# Patient Record
Sex: Male | Born: 1940 | Race: White | Hispanic: No | Smoking: Current every day smoker
Health system: Southern US, Community
[De-identification: ages and names within clinical notes are randomized; demographics above are authoritative.]

## PROBLEM LIST (undated history)

## (undated) DIAGNOSIS — R413 Other amnesia: Secondary | ICD-10-CM

## (undated) DIAGNOSIS — G4719 Other hypersomnia: Secondary | ICD-10-CM

## (undated) DIAGNOSIS — I739 Peripheral vascular disease, unspecified: Secondary | ICD-10-CM

## (undated) DIAGNOSIS — M25551 Pain in right hip: Secondary | ICD-10-CM

## (undated) DIAGNOSIS — G20 Parkinson's disease: Secondary | ICD-10-CM

## (undated) DIAGNOSIS — F17200 Nicotine dependence, unspecified, uncomplicated: Secondary | ICD-10-CM

## (undated) DIAGNOSIS — G252 Other specified forms of tremor: Secondary | ICD-10-CM

## (undated) DIAGNOSIS — F418 Other specified anxiety disorders: Secondary | ICD-10-CM

## (undated) DIAGNOSIS — J449 Chronic obstructive pulmonary disease, unspecified: Secondary | ICD-10-CM

## (undated) DIAGNOSIS — E7800 Pure hypercholesterolemia, unspecified: Secondary | ICD-10-CM

## (undated) HISTORY — DX: Nicotine dependence, unspecified, uncomplicated: F17.200

## (undated) HISTORY — DX: Pain in right hip: M25.551

## (undated) HISTORY — DX: Chronic obstructive pulmonary disease, unspecified: J44.9

## (undated) HISTORY — DX: Other specified anxiety disorders: F41.8

## (undated) HISTORY — DX: Other amnesia: R41.3

## (undated) HISTORY — DX: Parkinson's disease: G20

## (undated) HISTORY — DX: Pure hypercholesterolemia, unspecified: E78.00

## (undated) HISTORY — DX: Peripheral vascular disease, unspecified: I73.9

## (undated) HISTORY — DX: Other specified forms of tremor: G25.2

## (undated) HISTORY — DX: Other hypersomnia: G47.19

---

## 2015-11-07 ENCOUNTER — Encounter: Payer: Self-pay | Admitting: Neurology

## 2015-11-14 ENCOUNTER — Encounter: Payer: Self-pay | Admitting: Neurology

## 2017-08-26 ENCOUNTER — Ambulatory Visit: Payer: Medicare Other | Admitting: Neurology

## 2017-09-30 ENCOUNTER — Encounter: Payer: Self-pay | Admitting: Neurology

## 2017-09-30 ENCOUNTER — Telehealth: Payer: Self-pay

## 2017-09-30 ENCOUNTER — Ambulatory Visit (INDEPENDENT_AMBULATORY_CARE_PROVIDER_SITE_OTHER): Payer: Medicare Other | Admitting: Neurology

## 2017-09-30 VITALS — BP 161/79 | HR 57 | Ht 76.0 in | Wt 198.0 lb

## 2017-09-30 DIAGNOSIS — G20 Parkinson's disease: Secondary | ICD-10-CM

## 2017-09-30 NOTE — Patient Instructions (Signed)
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 I would like to review records from [place], [place] and your sister will call with the name of the doctor or the office and we will try to request previous records.   Please be cautious with changing positions as you balance is impaired. Please try to stand up slowly and get her bearings first. I would like for you to increase your water intake. Please stop smoking.   We may consider Parkinson's medication soon as well as medication for memory loss in the near future. Again, I would like to see if you have tried medications for Parkinson's disease before and/or for memory loss.  Try to stay active physically and mentally. Engage in social activities in your community and with your family and try to keep up with current events by reading the newspaper or watching the news.   As far as diagnostic testing, I may order a brain scan, called MRI in the near future, if you have not had one before in [place].  I would like to see you back in 4 months, sooner if we need to.  Our phone number is [phone]. We also have an after hours call service for urgent matters and there is a physician on-call for urgent questions, that cannot wait till the next work day. For any emergencies you know to call 911 or go to the nearest emergency room.

## 2017-09-30 NOTE — Telephone Encounter (Signed)
Pt reports that he was diagnosed with Parkinson's disease at a medical center in [place], [place] but is unsure of the name. He will call us back with the name of the center. Pt's MR release form has been filled out and signed, except for the name of the center.

## 2017-09-30 NOTE — Progress Notes (Signed)
Subjective:    Patient ID: Chase Logan is a 77 y.o. male.  HPI     Chase Foley, MD, PhD [facility] [address], [address] [address] [place], [place] [address]  Dear Dr. Petra Kuba,  I saw your patient, Chase Logan, upon your kind request in my neurologic clinic today for initial consultation of his tremors, concern for parkinsonism. The patient is accompanied by his younger sister today. As you know, Chase Logan is a 77 year old right-handed gentleman with an underlying medical history of smoking, COPD, anxiety, peripheral vascular disease, hyperlipidemia, and right hip pain, who reports a prior diagnosis of Parkinson's disease. He is not sure if he tried medication for this. He also has memory loss per sister's report. Neither one is sure whether he tried medication for memory loss before. He moved from [place], [place]. Prior neurology records are not available today for my review. He is widowed and has one daughter who lives in [place]. He has 2 grandchildren and 2 great-grandchildren. He is one of 14 kids. Most of his siblings have passed away. His sister reports that there is a family history of tremors in their father and one sister has tremors but no actual Parkinson's disease is reported. I reviewed your office note from 06/18/2017, which you kindly included. He is not sure if he had brain scans before. He is not able to recognize any Parkinson's medications by name. He used to live with a roommate in [place], [place]. He now lives with his sister. He smokes and has no intention to quit smoking. He has been smoking for 70 years. He does not utilize any alcohol but likes to drink caffeine. He reports no recent fall. His sister has noted short-term memory issues and balance issues. She believes that the tremor is worse on his left. His sister also reports that he was tested for his hearing and required hearing aids but he refused them.  His Past Medical History Is  Significant For: Past Medical History:  Diagnosis Date  . COPD (chronic obstructive pulmonary disease) (HCC)   . Nicotine dependence   . Other amnesia   . Other hypersomnia   . Other specified anxiety disorders   . Other specified forms of tremor   . Pain in right hip   . Parkinson's disease (HCC)   . Peripheral vascular disease (HCC)   . Pure hypercholesterolemia    His Past Surgical History Is Significant For:  His Family History Is Significant For: Family History  Problem Relation Age of Onset  . Stroke Mother   . Hypertension Mother   . Hypertension Father   . Lung cancer Father     His Social History Is Significant For: Social History   Socioeconomic History  . Marital status: Widowed    Spouse name: Not on file  . Number of children: Not on file  . Years of education: Not on file  . Highest education level: Not on file  Occupational History  . Not on file  Social Needs  . Financial resource strain: Not on file  . Food insecurity:    Worry: Not on file    Inability: Not on file  . Transportation needs:    Medical: Not on file    Non-medical: Not on file  Tobacco Use  . Smoking status: Current Every Day Smoker    Packs/day: 1.00  . Smokeless tobacco: Never Used  Substance and Sexual Activity  . Alcohol use: Not Currently  . Drug use:  Never  . Sexual activity: Not on file  Lifestyle  . Physical activity:    Days per week: Not on file    Minutes per session: Not on file  . Stress: Not on file  Relationships  . Social connections:    Talks on phone: Not on file    Gets together: Not on file    Attends religious service: Not on file    Active member of club or organization: Not on file    Attends meetings of clubs or organizations: Not on file    Relationship status: Not on file  Other Topics Concern  . Not on file  Social History Narrative  . Not on file    His Allergies Are:  No Known Allergies:   His Current Medications Are:  Outpatient  Encounter Medications as of 09/30/2017  Medication Sig  . ALPRAZolam (XANAX) 0.25 MG tablet Take 0.25 mg by mouth every 6 (six) hours as needed.   Marland Kitchen aspirin EC 81 MG tablet Take 81 mg by mouth daily.  Marland Kitchen atorvastatin (LIPITOR) 20 MG tablet Take 20 mg by mouth daily.  . budesonide-formoterol (SYMBICORT) 160-4.5 MCG/ACT inhaler Inhale 2 puffs into the lungs 2 (two) times daily.  . [DISCONTINUED] albuterol (PROVENTIL HFA;VENTOLIN HFA) 108 (90 Base) MCG/ACT inhaler Inhale 2 puffs into the lungs every 6 (six) hours as needed for wheezing or shortness of breath.   No facility-administered encounter medications on file as of 09/30/2017.   : Review of Systems:  Out of a complete 14 point review of systems, all are reviewed and negative with the exception of these symptoms as listed below:  Review of Systems  Neurological:       Pt presents today to discuss his PD. Pt reports that he was diagnosed with PD while living in [place] and needs neurological care in [place]. Pt is right handed.     Objective:  Neurological Exam  Physical Exam Physical Examination:   Vitals:   09/30/17 1440  BP: (!) 161/79  Pulse: (!) 57   General Examination: The patient is a very pleasant 77 y.o. male in no acute distress. He appears mildly deconditioned.   HEENT: Normocephalic, atraumatic, pupils are equal, round and reactive to light and accommodation. Extraocular tracking is impaired, he has right eye exotropia and lazy eye, focuses primarily with the left eye. Speech is not hypophonic, no significant dysarthria. Airway examination reveals moderate mouth dryness, intermittent lower lip tremor, moderate nuchal rigidity. Face is symmetric with mild facial masking noted. Tongue protrudes centrally and palate elevates symmetrically. Full dentures.   Chest: Clear to auscultation without wheezing, rhonchi or crackles noted.  Heart: S1+S2+0, regular and normal without murmurs, rubs or gallops noted.   Abdomen: Soft,  non-tender and non-distended with normal bowel sounds appreciated on auscultation.  Extremities: There is no pitting edema in the distal lower extremities bilaterally.   Skin: Warm and quite dry and flaky.   Musculoskeletal: exam reveals no obvious joint deformities, tenderness or joint swelling or erythema.   Neurologically:  Mental status: The patient is awake, alert and oriented in all 4 spheres. His immediate and remote memory, attention, language skills and fund of knowledge are appropriate. There is no evidence of aphasia, agnosia, apraxia or anomia. Speech is clear with normal prosody and enunciation. Thought process is linear. Mood is normal and affect is normal.  Cranial nerves II - XII are as described above under HEENT exam.  Motor exam: Thin bulk, mildly increased tone is noted on  the left. He has a mild to moderate resting tremor in the left upper extremity, a mild and intermittent resting tremor in the right upper extremity. Fine motor skills with finger taps and rapid alternating patting is mildly impaired on the left, slightly better on the right, foot taps and foot agility are mild to moderately impaired on the left and better on the right.   On 09/30/2017: Archimedes spiral drawing he has coarse difficulty with both hands. Handwriting is fairly large, tremulous, still legible, not obviously micrographic. Romberg is not tested for safety reasons. Reflexes are 1+ throughout. Fine motor skills and coordination: intact with normal finger taps, normal hand movements, normal rapid alternating patting, normal foot taps and normal foot agility.  Cerebellar testing: No dysmetria or intention tremor.  Sensory exam: intact to light touch.  Gait, station and balance: He stands up with mild difficulty and has a tendency to veer backwards. He did not bring a cane or walker. He walks slightly too fast, has mild decrease in arm swing, has a tendency to be unstable when turning but he has a tendency  to turn too fast as well. Tandem walk is not tested for safety reasons.   Assessment and Plan:    In summary, Chase Logan is a very pleasant 77 y.o.-year old male with an underlying medical history of smoking, COPD, anxiety, peripheral vascular disease, hyperlipidemia, and right hip pain, who presents for neurologic evaluation of his tremors and prior diagnosis of Parkinson's disease. 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 His history is not very elaborate. Unfortunately, prior neurology records are not available for my review today and we will request records once we have the name of the doctor or the clinic he was used to go to in [place], [place]. His sister will look at home for any prior medical records or name for the doctor. He is not on any medication for Parkinson's disease or tremor at this time. I would like to hold off until I can review records as to what he has tried in the past. 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 His smoking is also a vascular risk factor unfortunately. He is not motivated to quit smoking at this point. They report a family history of tremors. He may have had a brain scan in the past but it is not clear, again, I will await records to review. I may ask him to start Sinemet and low dose with gradual titration. He indicates that if he does not see any improvement he will not take the medication. He is encouraged to wait things out for now. He is advised to pursue a healthy lifestyle with increase in exercise level and increase in water intake. He does admit that he does not drink any water but 1 or 2 cups per day. I may order a brain MRI he has not had one in the recent past. I would like to see him back in about 3-4 months, sooner if needed. We will call him after I have received records from [place]. I answered all her  questions today and the patient and his sister were in agreement.   Thank you very much for allowing me to participate in the care of this nice patient. If I can be of any further assistance to you please do not hesitate to call me at [phone].  Sincerely,   Chase FoleySaima Puja Caffey, MD, PhD

## 2017-10-07 NOTE — Telephone Encounter (Signed)
[facility] records, imaging reports hospital summary. [phone]

## 2017-10-07 NOTE — Telephone Encounter (Signed)
Patient's sister Chase Logan (on [place]) calling to give names of medical centers patient was seen at in [place]. He was seen at [facility] in [place] and [facility] in [place].  The medication these offices prescribed were Baclofen 10mg  tid, Lisinopril 10mg  once a day and Meloxicam 7.5mg . The patient is not taking these medications.

## 2018-02-03 ENCOUNTER — Ambulatory Visit: Payer: Medicare Other | Admitting: Neurology

## 2018-02-09 ENCOUNTER — Other Ambulatory Visit (HOSPITAL_COMMUNITY): Payer: Self-pay | Admitting: Pulmonary Disease

## 2018-02-09 ENCOUNTER — Ambulatory Visit (HOSPITAL_COMMUNITY)
Admission: RE | Admit: 2018-02-09 | Discharge: 2018-02-09 | Disposition: A | Discharge status: Home / Self Care | Payer: Medicare Other | Source: Ambulatory Visit | Attending: Pulmonary Disease | Admitting: Pulmonary Disease

## 2018-02-09 DIAGNOSIS — W19XXXA Unspecified fall, initial encounter: Secondary | ICD-10-CM

## 2018-02-09 DIAGNOSIS — M25551 Pain in right hip: Secondary | ICD-10-CM | POA: Insufficient documentation

## 2018-02-09 DIAGNOSIS — M47816 Spondylosis without myelopathy or radiculopathy, lumbar region: Secondary | ICD-10-CM | POA: Diagnosis not present

## 2018-03-30 ENCOUNTER — Telehealth: Payer: Self-pay | Admitting: Neurology

## 2018-03-30 ENCOUNTER — Encounter: Payer: Self-pay | Admitting: Neurology

## 2018-03-30 ENCOUNTER — Ambulatory Visit (INDEPENDENT_AMBULATORY_CARE_PROVIDER_SITE_OTHER): Payer: Medicare Other | Admitting: Neurology

## 2018-03-30 VITALS — BP 149/79 | HR 63 | Ht 74.0 in | Wt 193.0 lb

## 2018-03-30 DIAGNOSIS — F17200 Nicotine dependence, unspecified, uncomplicated: Secondary | ICD-10-CM | POA: Diagnosis not present

## 2018-03-30 DIAGNOSIS — R413 Other amnesia: Secondary | ICD-10-CM | POA: Diagnosis not present

## 2018-03-30 DIAGNOSIS — G20 Parkinson's disease: Secondary | ICD-10-CM

## 2018-03-30 MED ORDER — CARBIDOPA-LEVODOPA 25-100 MG PO TABS: ORAL_TABLET | ORAL | 5 refills | Status: DC

## 2018-03-30 NOTE — Patient Instructions (Addendum)
We will start you on Sinemet (generic name: carbidopa-levodopa) 25/100 mg: Take half a pill twice daily (8 AM and noon) for one week, then half a pill 3 times a day (8 AM, noon, and 4 PM) for one week, then one pill 3 times a day thereafter. Please try to take the medication away from you mealtimes, that is, ideally either one hour before or 2 hours after your meal to ensure optimal absorption. The medication can interfere with the protein content of your meal and trying to the protein in your food and therefore not get fully absorbed.   Common side effects reported are: Nausea, vomiting, sedation, confusion, lightheadedness. Rare side effects include hallucinations, severe nausea or vomiting, diarrhea and significant drop in blood pressure especially when going from lying to standing or from sitting to standing.   Please stop smoking. Please increase your water intake to about 6 cups per day.   We will do a brain scan, called MRI and call you with the test results. We will have to schedule you for this on a separate date. This test requires authorization from your insurance, and we will take care of the insurance process.

## 2018-03-30 NOTE — Progress Notes (Signed)
Subjective:    Patient ID: Chase Logan is a 78 y.o. male.  HPI     Interim history:   Chase Logan is a 78 year old right-handed gentleman with an underlying medical history of smoking, COPD, anxiety, peripheral vascular disease, hyperlipidemia, and right hip pain, who presents for follow-up consultation of his parkinsonism. The patient is accompanied by his sister again today. I first met him on 09/30/2017 at the request of his primary care physician, at which time the patient reported a prior diagnosis of Parkinson's disease. He was not on any medication for this. He had moved from [place], [place]. I suggested her review records in the interim from his prior neurologist. He was advised to quit smoking. His examination showed signs of left-sided predominant parkinsonism. I was not able to review any neurology records. I received some emergency room records from [place].  Today, 03/30/2018: He reports a new symptoms but his sister is concerned about his memory loss. He is more and more forgetful. He still smokes, he is not willing to quit smoking. He does not always hydrate well with water although he does not mind drinking water. He averages about 2-3 cups of water per day and drinks some milk and some coffee, 1 soda per day on average. No recent falls.  The patient's allergies, current medications, family history, past medical history, past social history, past surgical history and problem list were reviewed and updated as appropriate.   Previously:  09/30/2017: (He) reports a prior diagnosis of Parkinson's disease. He is not sure if he tried medication for this. He also has memory loss per sister's report. Neither one is sure whether he tried medication for memory loss before. He moved from [place], [place]. Prior neurology records are not available today for my review. He is widowed and has one daughter who lives in [place]. He has 2 grandchildren and 2 great-grandchildren. He is one of 14 kids.  Most of his siblings have passed away. His sister reports that there is a family history of tremors in their father and one sister has tremors but no actual Parkinson's disease is reported. I reviewed your office note from 06/18/2017, which you kindly included. He is not sure if he had brain scans before. He is not able to recognize any Parkinson's medications by name. He used to live with a roommate in [place], [place]. He now lives with his sister. He smokes and has no intention to quit smoking. He has been smoking for 70 years. He does not utilize any alcohol but likes to drink caffeine. He reports no recent fall. His sister has noted short-term memory issues and balance issues. She believes that the tremor is worse on his left. His sister also reports that he was tested for his hearing and required hearing aids but he refused them.  His Past Medical History Is Significant For: Past Medical History:  Diagnosis Date  . COPD (chronic obstructive pulmonary disease) ([place])   . Nicotine dependence   . Other amnesia   . Other hypersomnia   . Other specified anxiety disorders   . Other specified forms of tremor   . Pain in right hip   . Parkinson's disease ([place])   . Peripheral vascular disease ([place])   . Pure hypercholesterolemia    His Past Surgical History Is Significant For:  His Family History Is Significant For: Family History  Problem Relation Age of Onset  . Stroke Mother   . Hypertension Mother   . Hypertension Father   .  Lung cancer Father     His Social History Is Significant For: Social History   Socioeconomic History  . Marital status: Widowed    Spouse name: Not on file  . Number of children: Not on file  . Years of education: Not on file  . Highest education level: Not on file  Occupational History  . Not on file  Social Needs  . Financial resource strain: Not on file  . Food insecurity:    Worry: Not on file    Inability: Not on file  . Transportation needs:     Medical: Not on file    Non-medical: Not on file  Tobacco Use  . Smoking status: Current Every Day Smoker    Packs/day: 1.00  . Smokeless tobacco: Never Used  Substance and Sexual Activity  . Alcohol use: Not Currently  . Drug use: Never  . Sexual activity: Not on file  Lifestyle  . Physical activity:    Days per week: Not on file    Minutes per session: Not on file  . Stress: Not on file  Relationships  . Social connections:    Talks on phone: Not on file    Gets together: Not on file    Attends religious service: Not on file    Active member of club or organization: Not on file    Attends meetings of clubs or organizations: Not on file    Relationship status: Not on file  Other Topics Concern  . Not on file  Social History Narrative  . Not on file    His Allergies Are:  No Known Allergies:   His Current Medications Are:  Outpatient Encounter Medications as of 03/30/2018  Medication Sig  . ALPRAZolam (XANAX) 0.25 MG tablet Take 0.25 mg by mouth every 6 (six) hours as needed.   Marland Kitchen aspirin EC 81 MG tablet Take 81 mg by mouth daily.  Marland Kitchen atorvastatin (LIPITOR) 20 MG tablet Take 20 mg by mouth daily.  . budesonide-formoterol (SYMBICORT) 160-4.5 MCG/ACT inhaler Inhale 2 puffs into the lungs 2 (two) times daily.   No facility-administered encounter medications on file as of 03/30/2018.   :  Review of Systems:  Out of a complete 14 point review of systems, all are reviewed and negative with the exception of these symptoms as listed below: Review of Systems  Neurological:       Pt presents today to discuss his memory and PD. Pt's sister reports that pt has become increasingly forgetful. Pt's sister reports that pt often looks like he will lose his balance when he stands.    Objective:  Neurological Exam  Physical Exam Physical Examination:   Vitals:   03/30/18 1042  BP: (!) 149/79  Pulse: 63    General Examination: The patient is a very pleasant 78 y.o. male in no  acute distress. He appears Mildly deconditioned, adequately groomed.   HEENT: Normocephalic, atraumatic, pupils are equal, round and reactive to light and accommodation. Extraocular tracking is impaired, he has right eye exotropia and lazy eye, focuses primarily with the left eye. Speech is not hypophonic, no significant dysarthria. Airway examination reveals moderate mouth dryness, intermittent lower lip tremor, moderate nuchal rigidity. Face is symmetric with mild facial masking noted. Tongue protrudes centrally and palate elevates symmetrically. Full dentures. No change in exam.   Chest: Clear to auscultation without wheezing, rhonchi or crackles noted.  Heart: S1+S2+0, regular and normal without murmurs, rubs or gallops noted.   Abdomen: Soft, non-tender and non-distended  with normal bowel sounds appreciated on auscultation.  Extremities: There is no pitting edema in the distal lower extremities bilaterally.   Skin: Warm and quite dry and flaky, chronic appearing discoloration.   Musculoskeletal: exam reveals no obvious joint deformities, tenderness or joint swelling or erythema.   Neurologically:  Mental status: The patient is awake, alert and oriented in all 4 spheres. His immediate and remote memory, attention, language skills and fund of knowledge are fairly appropriate. There is no evidence of aphasia, agnosia, apraxia or anomia. Speech is clear with normal prosody and enunciation. Thought process is linear. Mood is normal and affect is blunted.   Cranial nerves II - XII are as described above under HEENT exam.  Motor exam: Thin bulk, mildly increased tone is noted on the left. He has a mild to moderate resting tremor in the left upper extremity, a mild and intermittent resting tremor in the right upper extremity. Fine motor skills with finger taps and rapid alternating patting is mildly impaired on the left, slightly better on the right, foot taps and foot agility are mild to  moderately impaired on the left and better on the right, stable.   (On 09/30/2017: Archimedes spiral drawing he has coarse difficulty with both hands. Handwriting is fairly large, tremulous, still legible, not obviously micrographic.)   On 03/30/2018: MMSE: 25/30, CDT: 1/4, AFT: 8/min.   Romberg is not tested for safety reasons. Reflexes are 1+ throughout.  Cerebellar testing: No dysmetria or intention tremor.  Sensory exam: intact to light touch.  Gait, station and balance: He stands up with mild difficulty and has a tendency to veer backwards. He did not bring a cane or walker. He walks slightly too fast, has mild decrease in arm swing, has a tendency to be unstable when turning but he has a tendency to turn too fast as well. Tandem walk is not tested for safety reasons.   Assessment and Plan:    In summary, Chase Logan is a very pleasant 78 year old male with an underlying medical history of smoking, COPD, anxiety, peripheral vascular disease, hyperlipidemia, and right hip pain, who presents for neurologic evaluation of his tremors and prior diagnosis of Parkinson's disease. 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 His history is not very elaborate. Unfortunately, prior neurology records were not available or nonexistent. We will proceed with a brain MRI without contrast and a trial of Sinemet low-dose. He has some associated memory loss but also has significant vascular risk factors including a very long-standing history of smoking, he is not willing to quit smoking and is again urged to work on smoking cessation. He is advised to stay better hydrated with water. We talked about Sinemet, its expectations, side effects and scheduling as well as the titration. He was given written instructions as well as a new prescription. I suggested a 3 month recheck at which time we may consider a memory  medication as well. We will keep them posted as to his MRI results in the interim. I answered all their questions today and the patient and his sister were in agreement. I spent 25 minutes in total face-to-face time with the patient, more than 50% of which was spent in counseling and coordination of care, reviewing test results, reviewing medication and discussing or reviewing the diagnosis of PD its prognosis and treatment options. Pertinent laboratory and imaging test results that were available during this visit with the patient were reviewed  by me and considered in my medical decision making (see chart for details).

## 2018-03-30 NOTE — Telephone Encounter (Signed)
[employer] Medicare/medicaid order sent to [facility] for pt to be aware. I gave him [facility] phone number of [phone] and to give them a call if he has not heard in the next 2-3 business days.

## 2018-04-20 ENCOUNTER — Ambulatory Visit
Admission: RE | Admit: 2018-04-20 | Discharge: 2018-04-20 | Disposition: A | Discharge status: Home / Self Care | Payer: Medicare Other | Source: Ambulatory Visit | Attending: Neurology | Admitting: Neurology

## 2018-04-20 DIAGNOSIS — R413 Other amnesia: Secondary | ICD-10-CM

## 2018-04-20 DIAGNOSIS — F17200 Nicotine dependence, unspecified, uncomplicated: Secondary | ICD-10-CM

## 2018-04-20 DIAGNOSIS — G20 Parkinson's disease: Secondary | ICD-10-CM

## 2018-04-21 NOTE — Progress Notes (Signed)
Please call patient regarding the recent brain MRI: The brain scan showed moderate volume loss, which we call atrophy. There were changes in the deeper structures of the brain, which we call white matter changes or microvascular changes. These were reported as mild in His case. These are tiny white spots, that occur with time and are seen in a variety of conditions, including with normal aging, chronic hypertension, chronic headaches, especially migraine HAs, chronic diabetes, chronic hyperlipidemia. These are not strokes and no mass or lesion were seen which is reassuring. Again, there were no acute findings, such as a stroke, or mass or blood products. No further action is required on this test at this time, other than re-enforcing the importance of good blood pressure control, good cholesterol control, good blood sugar control, and weight management.  He can FU as scheduled in April 2020.  [name], MD, PhD

## 2018-04-22 ENCOUNTER — Telehealth: Payer: Self-pay

## 2018-04-22 NOTE — Telephone Encounter (Signed)
I called pt to discuss his MRI results. No answer, left a message asking him to call me back. 

## 2018-04-22 NOTE — Telephone Encounter (Signed)
Pt sister has returned call to SYSCON Kristen, she is asking for a call back

## 2018-04-22 NOTE — Telephone Encounter (Signed)
-----   Message from [name], MD sent at 04/21/2018  5:25 PM EST ----- Please call patient regarding the recent brain MRI: The brain scan showed moderate volume loss, which we call atrophy. There were changes in the deeper structures of the brain, which we call white matter changes or microvascular changes. These were reported as mild in His case. These are tiny white spots, that occur with time and are seen in a variety of conditions, including with normal aging, chronic hypertension, chronic headaches, especially migraine HAs, chronic diabetes, chronic hyperlipidemia. These are not strokes and no mass or lesion were seen which is reassuring. Again, there were no acute findings, such as a stroke, or mass or blood products. No further action is required on this test at this time, other than re-enforcing the importance of good blood pressure control, good cholesterol control, good blood sugar control, and weight management.  He can FU as scheduled in April 2020.  [name], MD, PhD

## 2018-04-22 NOTE — Telephone Encounter (Signed)
Pt's sister, Angelique Blonder, per [facility], returned my call. I advised her of pt's MRI results. I reminded pt's sister of pt's appt in April. Pt's sister verbalized understanding of results and recommendations and had no questions at this time.

## 2018-05-06 ENCOUNTER — Telehealth: Payer: Self-pay | Admitting: Neurology

## 2018-05-06 NOTE — Telephone Encounter (Signed)
I have no other suggestions for now. Let's see how he does on the smaller dose of his Sinemet. Also, he has a prescription for Xanax or alprazolam listed, I'm not sure how much he is taking of this, this can make him sleepy as well. If he is confused, he may need to get checked out for an underlying acute illness such as urinary tract infection or electrolyte disturbance and may have to be seen by his primary care physician for this and get labs and urine checked, if not already done on Tuesday.

## 2018-05-06 NOTE — Telephone Encounter (Signed)
Pt's sister Denise/DPR said Dr Gilpatrick/PCP cut carbidopa-levodopa (SINEMET IR) 25-100 MG tablet (on 05/04/18)to 1/2 pill 2 x day due to side effects. The patient is sleeping a lot, not eating (he has lost 14 pounds), and is very confused, more forgetful than before starting the medication. Please call to advise

## 2018-05-06 NOTE — Telephone Encounter (Signed)
I called [place], per DPR and discussed Dr. Teofilo Pod recommendations for the pt. Pt's sister does not think that his symptoms are being caused by the xanax. She will follow up with pt's PCP regarding labs and a urine test and to discuss his weight loss and the work up for an acute illness. Pt's sister verbalized understanding of the recommendations.

## 2018-05-06 NOTE — Telephone Encounter (Signed)
I called pt's sister, Angelique Blonder per [place]. She reports the sinemet has made the pt sleepy and confused. Pt saw his PCP Dr. Petra Kuba on Tuesday and he recommended that pt scale back to 1/2 tablet BID. They started this yesterday and pt's sister feels that it is too soon to see if the reduction is helping pt's symptoms. Pt's sister is wondering if there is anything else that Dr. Frances Furbish recommends.

## 2018-05-24 ENCOUNTER — Encounter: Payer: Self-pay | Admitting: Neurology

## 2018-06-24 NOTE — Progress Notes (Addendum)
PATIENT: Chase Logan DOB: 1940/04/24  REASON FOR VISIT: follow up HISTORY FROM: patient  Virtual Visit via Telephone Note  I connected with Chase Logan on 06/29/18 at 10:30 AM EDT by telephone and verified that I am speaking with the correct person using two identifiers.   I discussed the limitations, risks, security and privacy concerns of performing an evaluation and management service by telephone and the availability of in person appointments. I also discussed with the patient that there may be a patient responsible charge related to this service. The patient expressed understanding and agreed to proceed.   History of Present Illness:  06/29/18 Chase Logan is a 78 y.o. male for follow up of Parkinsonism.  Telephone visit completed with both [place] Logan and his Sister [place] Logan, with patient's permission.  Ch[place] reports that [place] did not tolerate Sinemet at all.  When taking 3 tablets/day she reports that his memory was significantly worse, he was much more sleepy, and seemed more confused than normal.  She does not feel that it helped with his tremor at all.  She continues to be very concerned about his memory.  She states that although this is better since decreasing Sinemet to one half of a tablet daily, he continues to struggle with short-term memory loss.  She states that about 3 months ago he had driven 93 miles out of the way as he could not remember how to return home.  She has recently taking his car keys and he is no longer driving.  She states that he is very repetitive in his speech.  He is able to perform activities of daily living but she shows some concern as yesterday he left the stove on after cooking his meal.  She is concerned about his safety.  She reports that he is unstable with his gait.  She denies any falls.  She has recently moved to [place] to help care for [place].  There are no other family members in the area.  She does not have a good support system to  help care for [facility].  She lives in [place] mobile [place] and has reached out to her neighbors for assistance from time to time.  She continues to work part-time.  She reports that they have an appointment in 2 weeks with his primary care provider to discuss options for in-home care or assisted living facilities.   History (copied from Dr Chase Logan note on 03/30/2018)  Chase Logan is a 78 year old right-handed gentleman with an underlying medical history of smoking, COPD, anxiety, peripheral vascular disease, hyperlipidemia, and right hip pain, who presents for follow-up consultation of his parkinsonism. The patient is accompanied by his sister again today. I first met him on 09/30/2017 at the request of his primary care physician, at which time the patient reported a prior diagnosis of Parkinsons disease. He was not on any medication for this. He had moved from [place], [place]. I suggested her review records in the interim from his prior neurologist. He was advised to quit smoking. His examination showed signs of left-sided predominant parkinsonism. I was not able to review any neurology records. I received some emergency room records from [place].  Today, 03/30/2018: He reports a new symptoms but his sister is concerned about his memory loss. He is more and more forgetful. He still smokes, he is not willing to quit smoking. He does not always hydrate well with water although he does not mind drinking water. He averages about 2-3  cups of water per day and drinks some milk and some coffee, 1 soda per day on average. No recent falls.  The patient's allergies, current medications, family history, past medical history, past social history, past surgical history and problem list were reviewed and updated as appropriate.   Previously:  09/30/2017: (He) reports a prior diagnosis of Parkinsons disease. He is not sure if he tried medication for this. He also has memory loss per sisters report. Neither one is sure  whether he tried medication for memory loss before. He moved from [place], [place].Prior neurology records are not available today for my review.He is widowed and has one daughter who lives in [place]. He has 2 grandchildren and 2 great-grandchildren. He is one of 14 kids. Most of his siblings have passed away. His sister reports that there is a family history of tremors in their father and one sister has tremors but no actual Parkinsons disease is reported. I reviewed your office note from 06/18/2017, which you kindly included. He is not sure if he had brain scans before. He is not able to recognize any Parkinsons medications by name. He used to live with a roommate in [place], [place]. He now lives with his sister. He smokes and has no intention to quit smoking. He has been smoking for 70 years. He does not utilize any alcohol but likes to drink caffeine. He reports no recent fall. His sister has noted short-term memory issues and balance issues. She believes that the tremor is worse on his left.His sister also reports that he was tested for his hearing and required hearing aids but he refused them.   Observations/Objective:  Generalized: in no acute distress  Mentation: Alert, NOT oriented to time or history taking. He is oriented to place. Follows all commands speech and language fluent.  Patient is very repetitive in his speech. Blind [place] performed via telephone conference.  Patient score is 12/22   Assessment and Plan:  78 y.o. year old male  has a past medical history of COPD (chronic obstructive pulmonary disease) ([place]), Nicotine dependence, Other amnesia, Other hypersomnia, Other specified anxiety disorders, Other specified forms of tremor, Pain in right hip, Parkinson's disease ([place]), Peripheral vascular disease ([place]), and Pure hypercholesterolemia. here with    ICD-10-CM   1. Memory loss R41.3 donepezil (ARICEPT) 5 MG tablet  2. Gait instability R26.81 Ambulatory referral to Physical  Therapy  3. Parkinsonism, unspecified Parkinsonism type ([place]) G20    Chase Logan is adamant that Zaim does not respond well to Sinemet and she does not want him taking this medication at this time.  I have advised that she stop Sinemet.  We have also discussed my concerns of starting Aricept as I am uncertain of Denise's expectations.  We have discussed in detail that Aricept will not reverse memory loss.  The hope is to delay progression.  She verbalizes understanding.  We will start Aricept 5 mg at night for 2 weeks, may increase to 10 mg at night if well tolerated.  Potential side effects discussed with the niece.  She was instructed to reach out should any concerns arise.  I will also refer Joni to physical therapy for evaluation of his gait.  Fall precautions advised.  I have encouraged Chase Logan to reach out to her family and Jay's primary care provider for additional resources regarding care for Talvin while she continues to work.  We have discussed safety concerns of we are being left unattended.  Fortunately he is not driving at this  time.  I have advised that he not to drive.  I have also requested that the need to bring Nathanel into the office in 6 weeks for a formal face-to-face visit once concerns for coronavirus are lower.  Denise verbalizes understanding and agreement with this plan.   Orders Placed This Encounter  Procedures   Ambulatory referral to Physical Therapy    Referral Priority:   Routine    Referral Type:   Physical Medicine    Referral Reason:   Specialty Services Required    Requested Specialty:   Physical Therapy    Number of Visits Requested:   1    Meds ordered this encounter  Medications   donepezil (ARICEPT) 5 MG tablet    Sig: Take 1 tablet (5 mg total) by mouth at bedtime. May increase to '10mg'$  every night after weeks    Dispense:  60 tablet    Refill:  2    Order Specific Question:   Supervising Provider    Answer:   Melvenia Beam [[id number]]     Follow Up  Instructions:  I discussed the assessment and treatment plan with the patient. The patient was provided an opportunity to ask questions and all were answered. The patient agreed with the plan and demonstrated an understanding of the instructions.   The patient was advised to call back or seek an in-person evaluation if the symptoms worsen or if the condition fails to improve as anticipated.  I provided 40 minutes of non-face-to-face time during this encounter.  Teleconference performed with Chase Logan and Frankey Poot at their place of residence, provider at her place of residence.  Liane Comber, RN help to facilitate visit.  Video conference was offered and suggested however Chase Logan declined.   Debbora Presto, NP   I reviewed the above note and documentation by the Nurse Practitioner and agree with the history, assessment and plan as outlined above. I was immediately available for phone consultation. Star Age, MD, PhD [facility])

## 2018-06-28 ENCOUNTER — Encounter: Payer: Self-pay | Admitting: *Deleted

## 2018-06-28 ENCOUNTER — Telehealth: Payer: Self-pay | Admitting: *Deleted

## 2018-06-28 NOTE — Telephone Encounter (Signed)
I called pt, denise sister of pt calling our office about appt.  She was very adamant about [place] seeing pt in person on site.  I explained that  Due to current COVID 19 pandemic, our office is severely reducing in office visits for at least the next 2 weeks, in order to minimize the risk to our patients and healthcare providers.  Recommended to convert appt to video visit, (best for him due to PD) but she did not want to do video (was adamant).   She felt like pt needed to be seen in person.  I told her that TELEPHONE VISIT was the next best thing.  She finally said ok. When I was going over last note, I mentioned xanax and she verbally said emphatically that he was only taking one tablet daily as needed.  Pt was taking sinemet 1/2 table once daily now due to when taking more 1 tablet po TID had confusion, memory loss, appetite changes (weight loss).  She did not want to see or talk to Dr Frances Furbish. She did not want to reschedule as she felt like he needed to be seen.

## 2018-06-28 NOTE — Telephone Encounter (Signed)
I have called Angelique Blonder and left a message via voicemail for her to return my call. I will discuss our concerns and try to facilitate any care that he needs.

## 2018-06-28 NOTE — Telephone Encounter (Signed)
I reviewed his chart, last office note and also discussed with our medical director, Dr. Marjory Lies, and I suggest that the best way to proceed is with a virtual visit, at least a phone visit if not a video-based visit for tomorrow's appointment. He is from the age standpoint, medical history standpoint (COPD Hx and ongoing smoking), considered high risk for contracting COVID 19 and at risk for complications, and therefore, from the medical safety standpoint I would like to avoid an office visit for him for now until approximately June. Amy Nicholas Lose will try to reach out to sister again for tomorrow's visit. We can try to evaluate for memory loss and consider memory medication and consider adjusting his Sinemet dose if needed. We may be able to order labs, he had a recent brain MRI in January. I am not sure about his last PCP visit.

## 2018-06-29 ENCOUNTER — Ambulatory Visit (INDEPENDENT_AMBULATORY_CARE_PROVIDER_SITE_OTHER): Payer: Medicare Other | Admitting: Family Medicine

## 2018-06-29 ENCOUNTER — Encounter: Payer: Self-pay | Admitting: Family Medicine

## 2018-06-29 ENCOUNTER — Other Ambulatory Visit: Payer: Self-pay

## 2018-06-29 DIAGNOSIS — R2681 Unsteadiness on feet: Secondary | ICD-10-CM | POA: Diagnosis not present

## 2018-06-29 DIAGNOSIS — G20 Parkinson's disease: Secondary | ICD-10-CM

## 2018-06-29 DIAGNOSIS — R413 Other amnesia: Secondary | ICD-10-CM | POA: Diagnosis not present

## 2018-06-29 MED ORDER — DONEPEZIL HCL 5 MG PO TABS: 5.0000 mg | ORAL_TABLET | Freq: Every day | ORAL | 2 refills | Status: DC

## 2018-07-06 ENCOUNTER — Ambulatory Visit: Payer: Medicare Other | Admitting: Neurology

## 2018-08-04 ENCOUNTER — Telehealth: Payer: Self-pay | Admitting: Family Medicine

## 2018-08-04 NOTE — Telephone Encounter (Signed)
Called pt and lm on voicemail to have pt call [facility] back to schedule 6 wk f/u w Shawnie Dapper, NP

## 2018-08-31 ENCOUNTER — Encounter

## 2018-08-31 ENCOUNTER — Ambulatory Visit: Payer: Medicare Other | Admitting: Neurology

## 2018-09-13 ENCOUNTER — Telehealth: Payer: Self-pay

## 2018-09-13 NOTE — Telephone Encounter (Signed)
Unable to get in contact with the patient to convert their office appt with Amy on 09/14/2018 into a mychart video visit. I left a voicemail asking the patient to return my call. Office number was provided.    If patient calls back please convert their office visit into a mychart video visit.   

## 2018-09-14 ENCOUNTER — Telehealth: Payer: Self-pay | Admitting: Family Medicine

## 2018-09-14 ENCOUNTER — Ambulatory Visit: Payer: Medicare Other | Admitting: Family Medicine

## 2018-09-14 NOTE — Telephone Encounter (Signed)
Called Chase Logan patient's sister at [phone] and asked her to call me back to discuss good times for Physical Therapy. Patient is scheduled for tomorrow 09/15/2018 at 9:00 am . I relayed my direct number on voice mail for Chase Logan to call me back. Thanks Hinton Dyer.

## 2018-09-14 NOTE — Telephone Encounter (Signed)
Time of call was 10:55am.   I have contacted Charlann Noss in regards to her concerns of Mr Chausse not being seen in the office today. I have discussed her concerns and relayed my understanding as well as an apology for the miscommunication. Mr Carlisi suffers from Parkinson's Disease. He has taken Sinemet in the past with worsening of symptoms. He was seen via televisit on 06/29/2018 when [facility] asked to discontinue Sinemet and expressed concerns of memory loss. Blind [place] 12/22. (see notes for exam findings). She asked for [place] to treat memory and was not interested in additional medications to specifically treat Parkinson's Disease. We started Aricept as prescribed and referral for PT was placed. I felt that a 6 week follow up was appropriate to determine response to medication as well as PT. I attempted to educated [name] on the natural progression of dementia associated with Parkinson's Disease. She requested a face to face visit with me. She was advised that we would see Mr Eyer in the office. When calling to confirm visit, appointment type was misunderstood as a virtual visit. Unfortunately, I did not recognize that Mr Diana was scheduled with me and failed to notify my nurse appropriately. I expressed this to [place]. She expressed gratitude for [place] reaching out to her. She asked that Mr Ledford be rescheduled with me. I made [name] aware that we were happy to see Mr Cosman at his appointed time of 1:30 today in the office. I also offered to see him tomorrow 6/24 if that worked better for her. She stated that she had already rearranged her schedule and preferred to keep scheduled appt for 09/28/2018 at 1:30. I have relayed messages to [facility] clinic lead Ward Givens as well as Maryelizabeth Kaufmann, [place] that his appointment was rescheduled for 09/28/2018 and will be performed in the office. I confirmed that Mr Jeffus is stable today with no changes from previous visit. He is safe and I feel that follow up  on 09/28/2018 is appropriate.

## 2018-09-15 NOTE — Telephone Encounter (Signed)
Called and spoke to patient's sister for follow up and she could not make physical therapy apt. 09/15/2018 because she had apt. Herself . Patient daughter was wondering about his physical therapy referral I relayed to her that Covid -78 was going on and that Neuro rehab was closed for a few weeks and then they had a back log . Patient's sister understood .

## 2018-09-16 ENCOUNTER — Ambulatory Visit: Payer: Medicare Other | Admitting: Physical Therapy

## 2018-09-28 ENCOUNTER — Other Ambulatory Visit: Payer: Self-pay

## 2018-09-28 ENCOUNTER — Encounter: Payer: Self-pay | Admitting: Family Medicine

## 2018-09-28 ENCOUNTER — Ambulatory Visit (INDEPENDENT_AMBULATORY_CARE_PROVIDER_SITE_OTHER): Payer: Medicare Other | Admitting: Family Medicine

## 2018-09-28 VITALS — BP 104/57 | HR 72 | Temp 97.7°F | Ht 74.0 in | Wt 184.8 lb

## 2018-09-28 DIAGNOSIS — R413 Other amnesia: Secondary | ICD-10-CM

## 2018-09-28 DIAGNOSIS — G20 Parkinson's disease: Secondary | ICD-10-CM | POA: Diagnosis not present

## 2018-09-28 MED ORDER — DONEPEZIL HCL 10 MG PO TABS: 5.0000 mg | ORAL_TABLET | Freq: Every day | ORAL | 3 refills | Status: DC

## 2018-09-28 MED ORDER — CARBIDOPA-LEVODOPA 25-100 MG PO TABS: ORAL_TABLET | ORAL | 3 refills | Status: DC

## 2018-09-28 NOTE — Progress Notes (Addendum)
PATIENT: Chase Logan DOB: [birth year]  REASON FOR VISIT: follow up HISTORY FROM: patient  Chief Complaint  Patient presents with   Follow-up    6 wk f/u. Sister present, Rm 2. Patient's sister stated that she has noticed a decline in his mental status.      HISTORY OF PRESENT ILLNESS: Today 09/29/18 Chase Logan is a 78 y.o. male here today for follow up for Parkinsonism and memory loss.  He is accompanied today by his sister, Chase Logan, who aids in history.  At his last visit we had discussed starting Aricept.  He did start 5 mg daily and has increased this dose to 10 mg at night.  Chase Logan feels that it may have helped a little bit.  Both deny any concerns of side effects.  Chase Logan reports that he is stable from his last visit in April.  He continues to struggle with short-term memory.  He is repetitive in speech.  Chase Logan reports that he will ask the same thing several times and for period of time.  He is able to perform ADLs at home.  He is not driving.  She has not noted any significant changes in his gait.  She denies any falls.  She does feel that he is slower with movements than was in the past. Per his primary care provider's recommendation they did restart half a tablet of Sinemet daily.  He has tolerated this dose fairly well.  No adverse reactions noted.  Chase Logan feels that it has definitely helped with his tremor.  HISTORY: (copied from my note on 06/29/2018)  Chase Logan is a 78 y.o. male for follow up of Parkinsonism.  Telephone visit completed with both Chase Logan and his Sister Chase Logan, with patient's permission.  Chase Logan reports that Chase Logan did not tolerate Sinemet at all.  When taking 3 tablets/day she reports that his memory was significantly worse, he was much more sleepy, and seemed more confused than normal.  She does not feel that it helped with his tremor at all.  She continues to be very concerned about his memory.  She states that although this is better since decreasing  Sinemet to one half of a tablet daily, he continues to struggle with short-term memory loss.  She states that about 3 months ago he had driven 93 miles out of the way as he could not remember how to return home.  She has recently taking his car keys and he is no longer driving.  She states that he is very repetitive in his speech.  He is able to perform activities of daily living but she shows some concern as yesterday he left the stove on after cooking his meal.  She is concerned about his safety.  She reports that he is unstable with his gait.  She denies any falls.  She has recently moved to [place] to help care for Chase Logan.  There are no other family members in the area.  She does not have a good support system to help care for Chase Logan.  She lives in a mobile [place] park and has reached out to her neighbors for assistance from time to time.  She continues to work part-time.  She reports that they have an appointment in 2 weeks with his primary care provider to discuss options for in-home care or assisted living facilities.   History (copied from Dr Guadelupe Sabin note on 03/30/2018)  Chase Logan is a 78 year old right-handed gentleman with an underlying medical history  of smoking, COPD, anxiety, peripheral vascular disease, hyperlipidemia, and right hip pain, who presents for follow-up consultation of his parkinsonism. The patient is accompanied byhis sister againtoday. I first met him on 09/30/2017 at the request of his primary care physician, at which time the patient reported a prior diagnosis of Parkinsons disease. He was not on any medication for this. He had moved from [place], [place]. I suggested her review records in the interim from his prior neurologist. He was advised to quit smoking. His examination showed signs of left-sided predominant parkinsonism. I was not able to review any neurology records. I received some emergency room records from [place].  Today, 03/30/2018: He reports a new symptoms but  his sister is concerned about his memory loss. He is more and more forgetful. He still smokes, he is not willing to quit smoking. He does not always hydrate well with water although he does not mind drinking water. He averages about 2-3 cups of water per day and drinks some milk and some coffee, 1 soda per day on average. No recent falls.  The patient's allergies, current medications, family history, past medical history, past social history, past surgical history and problem list were reviewed and updated as appropriate.  Previously:  09/30/2017: (He) reports a prior diagnosis of Parkinsons disease. He is not sure if he tried medication for this. He also has memory loss per sisters report. Neither one is sure whether he tried medication for memory loss before. He moved from [place], [place].Prior neurology records are not available today for my review.He is widowed and has one daughter who lives in [place]. He has 2 grandchildren and 2 great-grandchildren. He is one of 14 kids. Most of his siblings have passed away. His sister reports that there is a family history of tremors in their father and one sister has tremors but no actual Parkinsons disease is reported. I reviewed your office note from 06/18/2017, which you kindly included. He is not sure if he had brain scans before. He is not able to recognize any Parkinsons medications by name. He used to live with a roommate in [place], [place]. He now lives with his sister. He smokes and has no intention to quit smoking. He has been smoking for 70 years. He does not utilize any alcohol but likes to drink caffeine. He reports no recent fall. His sister has noted short-term memory issues and balance issues. She believes that the tremor is worse on his left.His sister also reports that he was tested for his hearing and required hearing aids but he refused them.  REVIEW OF SYSTEMS: Out of a complete 14 system review of symptoms, the patient complains only of  the following symptoms, memory loss, numbness of right leg, tremors and all other reviewed systems are negative.  ALLERGIES: No Known Allergies  HOME MEDICATIONS: Outpatient Medications Prior to Visit  Medication Sig Dispense Refill   ALPRAZolam (XANAX) 0.25 MG tablet Take 0.25 mg by mouth at bedtime as needed.      aspirin EC 81 MG tablet Take 81 mg by mouth daily.     atorvastatin (LIPITOR) 20 MG tablet Take 20 mg by mouth daily.     budesonide-formoterol (SYMBICORT) 160-4.5 MCG/ACT inhaler Inhale 2 puffs into the lungs 2 (two) times daily.     carbidopa-levodopa (SINEMET IR) 25-100 MG tablet Take 1/2 pill twice daily x 1 week, then 1/2 pill 3 times a day x 1 week, then 1 pill 3 times a day thereafter. (Patient taking differently: Take  1/2 pill daily) 90 tablet 5   donepezil (ARICEPT) 5 MG tablet Take 1 tablet (5 mg total) by mouth at bedtime. May increase to 19m every night after weeks 60 tablet 2   No facility-administered medications prior to visit.     PAST MEDICAL HISTORY: Past Medical History:  Diagnosis Date   COPD (chronic obstructive pulmonary disease) (HCC)    Nicotine dependence    Other amnesia    Other hypersomnia    Other specified anxiety disorders    Other specified forms of tremor    Pain in right hip    Parkinson's disease ([place]    Peripheral vascular disease ([place]    Pure hypercholesterolemia     PAST SURGICAL HISTORY: History reviewed. No pertinent surgical history.  FAMILY HISTORY: Family History  Problem Relation Age of Onset   Stroke Mother    Hypertension Mother    Hypertension Father    Lung cancer Father     SOCIAL HISTORY: Social History   Socioeconomic History   Marital status: Widowed    Spouse name: Not on file   Number of children: Not on file   Years of education: Not on file   Highest education level: Not on file  Occupational History   Not on file  Social Needs   Financial resource strain: Not on  file   Food insecurity    Worry: Not on file    Inability: Not on file   Transportation needs    Medical: Not on file    Non-medical: Not on file  Tobacco Use   Smoking status: Current Every Day Smoker    Packs/day: 1.00   Smokeless tobacco: Never Used  Substance and Sexual Activity   Alcohol use: Not Currently   Drug use: Never   Sexual activity: Not on file  Lifestyle   Physical activity    Days per week: Not on file    Minutes per session: Not on file   Stress: Not on file  Relationships   Social connections    Talks on phone: Not on file    Gets together: Not on file    Attends religious service: Not on file    Active member of club or organization: Not on file    Attends meetings of clubs or organizations: Not on file    Relationship status: Not on file   Intimate partner violence    Fear of current or ex partner: Not on file    Emotionally abused: Not on file    Physically abused: Not on file    Forced sexual activity: Not on file  Other Topics Concern   Not on file  Social History Narrative   Not on file      PHYSICAL EXAM  Vitals:   09/28/18 1313  BP: (!) 104/57  Pulse: 72  Temp: 97.7 F (36.5 C)  TempSrc: Oral  Weight: 184 lb 12.8 oz (83.8 kg)  Height: _0  (1.88 m)   Body mass index is 23.73 kg/m.  Generalized: Well developed, in no acute distress  Cardiology: normal rate and rhythm, no murmur noted Neurological examination  Mentation: Alert, not oriented to year but oriented to month and date. Follows all commands speech and language fluent Cranial nerve II-XII: Pupils were equal round reactive to light. Extraocular movements were full, visual field were full on confrontational test. Facial sensation and strength were normal. Uvula tongue midline. Head turning and shoulder shrug  were normal and symmetric. Motor: The motor testing  reveals 5 over 5 strength of all 4 extremities. Good symmetric motor tone is noted throughout. Very  slight bradycardia noted with finger taps and dysrhythmic toe taps Sensory: Sensory testing is intact to soft touch on all 4 extremities. No evidence of extinction is noted.  Coordination: Cerebellar testing reveals good finger-nose-finger and heel-to-shin bilaterally.  Gait and station: Gait is normal. Tandem gait is normal. Romberg is negative. No drift is seen.  Reflexes: Deep tendon reflexes are symmetric and normal bilaterally.   DIAGNOSTIC DATA (LABS, IMAGING, TESTING) - I reviewed patient records, labs, notes, testing and imaging myself where available.  MMSE - Mini Mental State Exam 09/28/2018 03/30/2018  Not completed: (No Data) -  Orientation to time 3 5  Orientation to Place 1 2  Registration 3 2  Attention/ Calculation 4 5  Recall 1 2  Language- name 2 objects 2 2  Language- repeat 1 1  Language- follow 3 step command 3 3  Language- read & follow direction 1 1  Write a sentence 1 1  Copy design 1 1  Total score 21 25     No results found for: WBC, HGB, HCT, MCV, PLT No results found for: NA, K, CL, CO2, GLUCOSE, BUN, CREATININE, CALCIUM, PROT, ALBUMIN, AST, ALT, ALKPHOS, BILITOT, GFRNONAA, GFRAA No results found for: CHOL, HDL, LDLCALC, LDLDIRECT, TRIG, CHOLHDL No results found for: HGBA1C No results found for: VITAMINB12 No results found for: TSH     ASSESSMENT AND PLAN 78 y.o. year old male  has a past medical history of COPD (chronic obstructive pulmonary disease) ([place]), Nicotine dependence, Other amnesia, Other hypersomnia, Other specified anxiety disorders, Other specified forms of tremor, Pain in right hip, Parkinson's disease ([place]), Peripheral vascular disease ([place]), and Pure hypercholesterolemia. here with     ICD-10-CM   1. Parkinsonism, unspecified Parkinsonism type ([place])  G20 NM BRAIN DATSCAN TUMOR LOC INFLAM SPECT 1 DAY  2. Memory loss  R41.3 donepezil (ARICEPT) 10 MG tablet    Overall he is stable.  Chase Logan feels that Aricept may have helped a little with  his memory.  She is mostly concerned about the progressive decline over the past few months.  She is uncertain regarding a diagnosis of Parkinson's disease.  He is responding well to Sinemet at 1/2 tablet daily.  I have offered to perform a DaTscan for evaluation.  Chase Logan would like for this to be done.  I have also suggested that we increase Sinemet to half a tablet twice daily.  She was instructed to call me with any concerns or side effects.  We will continue Aricept 10 mg at bedtime.  I have encouraged regular exercise.  We have escorted Loel and Chase Logan to neuro rehab at today's visit to schedule physical therapy appointments.  I have advised that she call if there are any concerns with completing these visits.  We will follow-up very closely.  She verbalizes understanding and agreement with this plan.   Orders Placed This Encounter  Procedures   NM BRAIN DATSCAN TUMOR LOC INFLAM SPECT 1 DAY    Standing Status:   Future    Standing Expiration Date:   11/30/2019    Order Specific Question:   If indicated for the ordered procedure, I authorize the administration of a radiopharmaceutical per Radiology protocol    Answer:   Yes    Order Specific Question:   Preferred imaging location?    Answer:   [facility]    Order Specific Question:   Radiology  Contrast Protocol - do NOT remove file path    Answer:   [email or web address]     Meds ordered this encounter  Medications   carbidopa-levodopa (SINEMET IR) 25-100 MG tablet    Sig: Take 1/2 pill twice daily    Dispense:  135 tablet    Refill:  3    Order Specific Question:   Supervising Provider    Answer:   Melvenia Beam [[id number]]   donepezil (ARICEPT) 10 MG tablet    Sig: Take 0.5 tablets (5 mg total) by mouth at bedtime. May increase to 36m every night after weeks    Dispense:  90 tablet    Refill:  3    Order Specific Question:   Supervising Provider    Answer:   AMelvenia Beam[[id number]     I  spent 15 minutes with the patient. 50% of this time was spent counseling and educating patient on plan of care and medications.    ADebbora Presto FNP-C 09/29/2018, 4:20 PM [facility] [address] [place] [phone] I reviewed the above note and documentation by the Nurse Practitioner and agree with the history, physical exam, assessment and plan as outlined above. I was immediately available for face-to-face consultation. SStar Age MD, PhD [facility] ([facility]

## 2018-09-28 NOTE — Patient Instructions (Signed)
We will order DaT Scan   Increase Sinemet to 1/2 tablet twice daily  Continue Aricept 10mg  daily  Call to schedule PT   Parkinson's Disease Parkinson's disease is a type of movement disorder. It is a long-term condition that gets worse over time (is progressive). Each person with Parkinson's disease is affected differently. This condition limits your ability to control movements and move your body normally. The condition can range from mild to severe. Parkinson's disease tends to get worse slowly over several years. What are the causes? Parkinson's disease results from a loss of brain cells (neurons) that make a brain chemical called dopamine. Dopamine is needed to control movement. As the condition gets worse, neurons make less dopamine. This makes it hard to move or control your movements. The exact cause of the loss of neurons and why they make less dopamine is not known. Factors related to genes and the environment may contribute to the cause of Parkinson's disease. What increases the risk? The following factors may make you more likely to develop this condition:  Being male.  Being age 79 or older.  Having a family history of Parkinson's disease.  Having had a traumatic brain injury.  Having experienced depression.  Having been exposed to toxins, such as pesticides. What are the signs or symptoms? Symptoms of this condition can vary. The main symptoms are related to movement. These include:  A tremor or shaking while you are resting that you cannot control.  Stiffness in your neck, arms, and legs (rigidity).  Slowing of movement. You may lose facial expressions and have trouble making small movements that are needed to button clothing or brush your teeth.  An abnormal walk. You may walk with short, shuffling steps.  Loss of balance and stability when standing. You may sway, fall backward, and have trouble making turns. Other symptoms include:  Mental or cognitive  changes including depression, anxiety, having false beliefs (delusions), or seeing, hearing, or feeling things that do not exist (hallucinations).  Trouble speaking or swallowing.  Changes in bowel or bladder functions including constipation, having to go urgently or frequently, or not being able to control your bowel or bladder.  Changes in sleep habits or trouble sleeping. Parkinson's disease may be graded by severity of your condition as mild, moderate, or advanced. Parkinson's disease progression is different for everyone. You may not progress to the advanced stage.  Mild Parkinson's disease involves: ? Movement problems that do not affect daily activities. ? Movement problems on one side of the body.  Moderate Parkinson's disease involves: ? Movement problems on both sides of the body. ? Slowing of movement. ? Coordination and balance problems.  Advanced Parkinson's disease involves: ? Extreme difficulty walking. ? Inability to live alone safely. ? Signs of dementia, such as having trouble remembering things, doing daily tasks such as getting dressed, and problem solving. How is this diagnosed? This condition is diagnosed by a specialist. A diagnosis may be made based on symptoms, your medical history, and a physical exam. You may also have brain imaging tests to check for a loss of dopamine-producing areas of the brain. How is this treated? There is no cure for Parkinson's disease. Treatment focuses on managing your symptoms. Treatment may include:  Medicines. Everyone responds to medicines differently. Your response may change over time. Work with your health care provider to find the best medicines for you.  Speech, occupational, and physical therapy.  Deep brain stimulation surgery to reduce tremors and other involuntary movements. Follow  these instructions at home: Medicines  Take over-the-counter and prescription medicines only as told by your health care provider.   Avoid taking medicines that can affect thinking, such as pain or sleeping medicines. Eating and drinking  Follow instructions from your health care provider about eating or drinking restrictions.  Do not drink alcohol. Activity  Talk with your health care provider about if it is safe for you to drive.  Do exercises as told by your health care provider or physical therapist. Lifestyle      Install grab bars and railings in your home to prevent falls.  Do not use any products that contain nicotine or tobacco, such as cigarettes, e-cigarettes, and chewing tobacco. If you need help quitting, ask your health care provider.  Consider joining a support group for people with Parkinson's disease. General instructions  Work with your health care provider to determine what you need help with and what your safety needs are.  Keep all follow-up visits as told by your health care provider, including any visits with a physical therapist, speech therapist, or occupational therapist. This is important. Contact a health care provider if:  Medicines do not help your symptoms.  You are unsteady or have fallen at home.  You need more support to function well at home.  You have trouble swallowing.  You have severe constipation.  You are having problems with side effects from your medicines.  You feel confused, anxious, or depressed. Get help right away if you:  Are injured after a fall.  See or hear things that are not real.  Cannot swallow without choking.  Have chest pain or trouble breathing.  Do not feel safe at home.  Have thoughts about hurting yourself or others. If you ever feel like you may hurt yourself or others, or have thoughts about taking your own life, get help right away. You can go to your nearest emergency department or call:  Your local emergency services (911 in the [place]  A suicide crisis helpline, such as the National Suicide Prevention Lifeline at  641-511-68771-615-824-4360. This is open 24 hours a day. Summary  Parkinson's disease is a long-term condition that gets worse over time. This condition limits your ability to control your movements and move your body normally.  There is no cure for Parkinson's disease. Treatment focuses on managing your symptoms.  Work with your health care provider to determine what you need help with and what your safety needs are.  Keep all follow-up visits as told by your health care provider, including any visits with a physical therapist, speech therapist, or occupational therapist. This is important. This information is not intended to replace advice given to you by your health care provider. Make sure you discuss any questions you have with your health care provider. Document Released: 03/07/2000 Document Revised: 05/27/2018 Document Reviewed: 05/27/2018 Elsevier Patient Education  2020 [employer]

## 2018-09-29 ENCOUNTER — Encounter: Payer: Self-pay | Admitting: Family Medicine

## 2018-10-19 ENCOUNTER — Ambulatory Visit: Payer: Medicare Other | Admitting: Rehabilitative and Restorative Service Providers"

## 2018-10-26 ENCOUNTER — Ambulatory Visit: Payer: Medicare Other | Admitting: Physical Therapy

## 2018-10-27 ENCOUNTER — Telehealth: Payer: Self-pay | Admitting: Family Medicine

## 2018-10-27 NOTE — Telephone Encounter (Signed)
Mailed Chase Logan Paper work for DAT so I can get approved for insurance  . Chase Logan will mail be back Consent and then process will start.

## 2018-11-02 ENCOUNTER — Ambulatory Visit: Payer: Medicare Other | Admitting: Physical Therapy

## 2018-11-10 ENCOUNTER — Telehealth: Payer: Self-pay | Admitting: *Deleted

## 2018-11-10 ENCOUNTER — Other Ambulatory Visit: Payer: Self-pay | Admitting: *Deleted

## 2018-11-10 DIAGNOSIS — R413 Other amnesia: Secondary | ICD-10-CM

## 2018-11-10 MED ORDER — CARBIDOPA-LEVODOPA 25-100 MG PO TABS: ORAL_TABLET | ORAL | 3 refills | Status: DC

## 2018-11-10 MED ORDER — DONEPEZIL HCL 10 MG PO TABS: 5.0000 mg | ORAL_TABLET | Freq: Every day | ORAL | 3 refills | Status: DC

## 2018-11-10 NOTE — Telephone Encounter (Signed)
I called and LMVM for sister of pt, [name].  I wanted to ask if changing pharmacy to [facility].  She stated yes this was correct.  I will go ahead and change pharmacy and do new prescriptions.

## 2018-11-10 NOTE — Telephone Encounter (Signed)
I called and LMVM for sister of pt to return call about what pharmacy to use for med.  Received fax from [facility] about getting meds mail order.  Please advise.

## 2018-11-16 ENCOUNTER — Inpatient Hospital Stay (HOSPITAL_COMMUNITY)
Admission: RE | Admit: 2018-11-16 | Discharge: 2018-11-16 | Disposition: A | Discharge status: Home / Self Care | Payer: Medicare Other | Source: Ambulatory Visit

## 2018-11-16 ENCOUNTER — Other Ambulatory Visit (HOSPITAL_COMMUNITY): Payer: Self-pay | Admitting: Pulmonary Disease

## 2018-11-16 ENCOUNTER — Encounter (HOSPITAL_COMMUNITY): Payer: Medicare Other

## 2018-11-16 ENCOUNTER — Inpatient Hospital Stay (HOSPITAL_COMMUNITY): Admission: RE | Admit: 2018-11-16 | Payer: Medicare Other | Source: Ambulatory Visit

## 2018-11-16 DIAGNOSIS — I739 Peripheral vascular disease, unspecified: Secondary | ICD-10-CM

## 2018-11-16 NOTE — Telephone Encounter (Signed)
Pt sister is asking for a call from Hinton Dyer to discuss the status of the DAT scan. Pt sister states she faxed the paperwork in on last week.

## 2018-11-23 NOTE — Telephone Encounter (Signed)
Called and spoke to patient's sister relayed DAT scan was approved and gave her telephone number for scheduling [phone]

## 2018-11-25 ENCOUNTER — Ambulatory Visit (HOSPITAL_COMMUNITY): Payer: Medicare Other

## 2018-11-25 ENCOUNTER — Other Ambulatory Visit (HOSPITAL_COMMUNITY): Payer: Medicare Other

## 2018-11-30 ENCOUNTER — Ambulatory Visit (HOSPITAL_COMMUNITY)
Admission: RE | Admit: 2018-11-30 | Discharge: 2018-11-30 | Disposition: A | Discharge status: Home / Self Care | Payer: Medicare Other | Source: Ambulatory Visit | Attending: Pulmonary Disease | Admitting: Pulmonary Disease

## 2018-11-30 ENCOUNTER — Ambulatory Visit (INDEPENDENT_AMBULATORY_CARE_PROVIDER_SITE_OTHER)
Admission: RE | Admit: 2018-11-30 | Discharge: 2018-11-30 | Disposition: A | Discharge status: Home / Self Care | Payer: Medicare Other | Source: Ambulatory Visit | Attending: Pulmonary Disease | Admitting: Pulmonary Disease

## 2018-11-30 ENCOUNTER — Other Ambulatory Visit: Payer: Self-pay

## 2018-11-30 DIAGNOSIS — I739 Peripheral vascular disease, unspecified: Secondary | ICD-10-CM | POA: Insufficient documentation

## 2018-12-22 ENCOUNTER — Ambulatory Visit (HOSPITAL_COMMUNITY): Admission: RE | Admit: 2018-12-22 | Payer: Medicare Other | Source: Ambulatory Visit

## 2018-12-22 ENCOUNTER — Other Ambulatory Visit (HOSPITAL_COMMUNITY): Payer: Medicare Other

## 2018-12-30 ENCOUNTER — Ambulatory Visit (HOSPITAL_COMMUNITY): Admission: RE | Admit: 2018-12-30 | Payer: Medicare Other | Source: Ambulatory Visit

## 2018-12-30 ENCOUNTER — Encounter (HOSPITAL_COMMUNITY): Payer: Medicare Other

## 2019-01-13 ENCOUNTER — Telehealth: Payer: Self-pay | Admitting: Family Medicine

## 2019-01-13 NOTE — Telephone Encounter (Signed)
Curtis from [facility] PA called to discuss the Brain Dat Scan that was ordered for the pt. Please advise.

## 2019-01-17 NOTE — Telephone Encounter (Signed)
Called back and left a message asking for a return call [phone]

## 2019-01-18 ENCOUNTER — Encounter (HOSPITAL_COMMUNITY): Admission: RE | Admit: 2019-01-18 | Payer: Medicare Other | Source: Ambulatory Visit

## 2019-01-18 ENCOUNTER — Encounter (HOSPITAL_COMMUNITY): Payer: Medicare Other

## 2019-01-18 NOTE — Telephone Encounter (Signed)
I have called Getting patient a new auth patient did not go in time frame . Auth pending . Thanks Hinton Dyer

## 2019-01-18 NOTE — Telephone Encounter (Signed)
I had to reauth. new PA approval ref# [id number] with date range 10/27-12/01/2019. I spoke to sister and she talked  for 30 minutes I listened . I relayed to her that [facility] had staffing issues . Lovena Le will called patient to schedule.

## 2019-01-18 NOTE — Telephone Encounter (Signed)
Pt's sister called wanting to know the update on this. Please advise.

## 2019-01-25 ENCOUNTER — Ambulatory Visit (INDEPENDENT_AMBULATORY_CARE_PROVIDER_SITE_OTHER): Payer: Medicare Other | Admitting: Vascular Surgery

## 2019-01-25 ENCOUNTER — Other Ambulatory Visit: Payer: Self-pay

## 2019-01-25 DIAGNOSIS — I70211 Atherosclerosis of native arteries of extremities with intermittent claudication, right leg: Secondary | ICD-10-CM | POA: Diagnosis not present

## 2019-01-25 DIAGNOSIS — I70219 Atherosclerosis of native arteries of extremities with intermittent claudication, unspecified extremity: Secondary | ICD-10-CM | POA: Insufficient documentation

## 2019-01-25 MED ORDER — CILOSTAZOL 100 MG PO TABS: 100.0000 mg | ORAL_TABLET | Freq: Two times a day (BID) | ORAL | 11 refills | Status: DC

## 2019-01-25 NOTE — Progress Notes (Signed)
Patient name: Chase Logan MRN: [id number] DOB: 01/18/1941 Sex: male  REASON FOR CONSULT: Evaluate for PAD  HPI: Chase Logan is a 78 y.o. male, with history of COPD, Parkinson's, Alzheimer's, hyperlipidemia, tobacco abuse that presents for evaluation of PAD.  Patient states that he has been having trouble with his right leg for the last 20 years but this has gotten significantly worse over the last year.  He has a lot of pain in his right thigh.  He is here with his sister who reports he used to be able to walk around [place] without any issues but now can barely walk into the store without having to stop.  No pain in the foot.  No tissue loss.  Pain is always in the thigh and seems worse with ambulation.  He did have vascular studies on 11/30/2018 that shows a 30 to 49% stenosis of the right SFA and 50 to 74% stenosis of the left SFA.  He reports no issues with his left leg.  States he smokes about a carton of cigarettes a week.  States he has no indication of stopping.  Does take an aspirin daily.  ABIs were 0.97 on the right 1.01 on the left.  Past Medical History:  Diagnosis Date  . COPD (chronic obstructive pulmonary disease) (HCC)   . Nicotine dependence   . Other amnesia   . Other hypersomnia   . Other specified anxiety disorders   . Other specified forms of tremor   . Pain in right hip   . Parkinson's disease (HCC)   . Peripheral vascular disease (HCC)   . Pure hypercholesterolemia     No past surgical history on file.  Family History  Problem Relation Age of Onset  . Stroke Mother   . Hypertension Mother   . Hypertension Father   . Lung cancer Father     SOCIAL HISTORY: Social History   Socioeconomic History  . Marital status: Widowed    Spouse name: Not on file  . Number of children: Not on file  . Years of education: Not on file  . Highest education level: Not on file  Occupational History  . Not on file  Social Needs  . Financial resource strain: Not on  file  . Food insecurity    Worry: Not on file    Inability: Not on file  . Transportation needs    Medical: Not on file    Non-medical: Not on file  Tobacco Use  . Smoking status: Current Every Day Smoker    Packs/day: 1.00  . Smokeless tobacco: Never Used  Substance and Sexual Activity  . Alcohol use: Not Currently  . Drug use: Never  . Sexual activity: Not on file  Lifestyle  . Physical activity    Days per week: Not on file    Minutes per session: Not on file  . Stress: Not on file  Relationships  . Social Musician on phone: Not on file    Gets together: Not on file    Attends religious service: Not on file    Active member of club or organization: Not on file    Attends meetings of clubs or organizations: Not on file    Relationship status: Not on file  . Intimate partner violence    Fear of current or ex partner: Not on file    Emotionally abused: Not on file    Physically abused: Not on file  Forced sexual activity: Not on file  Other Topics Concern  . Not on file  Social History Narrative  . Not on file    No Known Allergies  Current Outpatient Medications  Medication Sig Dispense Refill  . ALPRAZolam (XANAX) 0.25 MG tablet Take 0.25 mg by mouth at bedtime as needed.     Marland Kitchen aspirin EC 81 MG tablet Take 81 mg by mouth daily.    Marland Kitchen atorvastatin (LIPITOR) 20 MG tablet Take 20 mg by mouth daily.    . budesonide-formoterol (SYMBICORT) 160-4.5 MCG/ACT inhaler Inhale 2 puffs into the lungs 2 (two) times daily.    . carbidopa-levodopa (SINEMET IR) 25-100 MG tablet Take 1/2 pill twice daily 135 tablet 3  . donepezil (ARICEPT) 10 MG tablet Take 0.5 tablets (5 mg total) by mouth at bedtime. May increase to 10mg  every night after weeks 90 tablet 3  . cilostazol (PLETAL) 100 MG tablet Take 1 tablet (100 mg total) by mouth 2 (two) times daily before a meal. 60 tablet 11   No current facility-administered medications for this visit.     REVIEW OF SYSTEMS:   [X]  denotes positive finding, [ ]  denotes negative finding Cardiac  Comments:  Chest pain or chest pressure:    Shortness of breath upon exertion:    Short of breath when lying flat:    Irregular heart rhythm:        Vascular    Pain in calf, thigh, or hip brought on by ambulation: x Right  Pain in feet at night that wakes you up from your sleep:     Blood clot in your veins:    Leg swelling:         Pulmonary    Oxygen at home:    Productive cough:     Wheezing:         Neurologic    Sudden weakness in arms or legs:     Sudden numbness in arms or legs:     Sudden onset of difficulty speaking or slurred speech:    Temporary loss of vision in one eye:     Problems with dizziness:         Gastrointestinal    Blood in stool:     Vomited blood:         Genitourinary    Burning when urinating:     Blood in urine:        Psychiatric    Major depression:         Hematologic    Bleeding problems:    Problems with blood clotting too easily:        Skin    Rashes or ulcers:        Constitutional    Fever or chills:      PHYSICAL EXAM: Vitals:   01/25/19 0920  BP: (!) 141/62  Pulse: 95  Resp: 18  Temp: 97.8 F (36.6 C)  TempSrc: Temporal  SpO2: 95%  Weight: 185 lb 4.8 oz (84.1 kg)  Height: 6\' 8"  (2.032 m)    GENERAL: The patient is a well-nourished male, in no acute distress. The vital signs are documented above. CARDIAC: There is a regular rate and rhythm.  VASCULAR:  2+ femoral pulse palpable bilateral groin No palpable right pedal pulses, no tissue loss Weakly palpable left DP PULMONARY: There is good air exchange bilaterally without wheezing or rales. ABDOMEN: Soft and non-tender with normal pitched bowel sounds.  MUSCULOSKELETAL: There are no major deformities or cyanosis.  NEUROLOGIC: No focal weakness or paresthesias are detected. SKIN: There are no ulcers or rashes noted. PSYCHIATRIC: The patient has a normal affect.  DATA:   Arterial duplex  11/30/2018 suggest 30 to 49% right SFA stenosis and 50 to 70% left SFA stenosis.  Monophasic tibial runoff on the right.  ABIs 11/30/2018: 0.97 on the right triphasic and 1.1 on the left biphasic  Assessment/Plan:  78 year old male with multiple medical comorbidities including heavy tobacco abuse that presents for evaluation of PAD.  He states his right leg is the problem and he has no issues with his left leg.  I cannot palpate any pedal pulses in the right foot even though his ABIs would suggest triphasic waveform at the PT.  His history is more consistent with claudication, I discussed with him and his sister that this is not a limb threatening situation.  Discussed the importance of smoking cessation and he has very clear stated that he has no intention to stop smoking.  Discussed that we would typically manage this with medical management and conservative management initially.  I did prescribe Pletal 100 mg twice daily.  Discussed aspirin as well which he is already taking.  I will plan to see him back in 3 months with repeat ABIs to see how he is doing.  Discussed certainly if he has no improvement and continued progression of symptoms we could plan for right lower extremity arteriogram and possible intervention.  He has very mild SFA disease and looks like much more severe tibial disease based on his duplex in the right leg.   Cephus Shellinghristopher J. Joven Mom, MD [facility] Office: [phone] Pager: [phone]

## 2019-01-27 ENCOUNTER — Other Ambulatory Visit: Payer: Self-pay

## 2019-01-27 DIAGNOSIS — I70211 Atherosclerosis of native arteries of extremities with intermittent claudication, right leg: Secondary | ICD-10-CM

## 2019-02-22 ENCOUNTER — Encounter (HOSPITAL_COMMUNITY)
Admission: RE | Admit: 2019-02-22 | Discharge: 2019-02-22 | Disposition: A | Discharge status: Home / Self Care | Payer: Medicare Other | Source: Ambulatory Visit | Attending: Family Medicine | Admitting: Family Medicine

## 2019-02-22 ENCOUNTER — Other Ambulatory Visit: Payer: Self-pay

## 2019-02-22 DIAGNOSIS — G20 Parkinson's disease: Secondary | ICD-10-CM | POA: Insufficient documentation

## 2019-02-22 MED ORDER — IOFLUPANE I 123 185 MBQ/2.5ML IV SOLN
5.0000 | Freq: Once | INTRAVENOUS | Status: AC | PRN
  Administered 2019-02-22: 11:00:00 5 via INTRAVENOUS
  Filled 2019-02-22: qty 5

## 2019-02-22 MED ORDER — IODINE STRONG (LUGOLS) 5 % PO SOLN
0.8000 mL | Freq: Once | ORAL | Status: AC
  Administered 2019-02-22: 10:00:00 0.8 mL via ORAL

## 2019-02-22 MED ORDER — IODINE STRONG (LUGOLS) 5 % PO SOLN
ORAL | Status: AC
  Filled 2019-02-22: qty 1

## 2019-03-01 NOTE — Progress Notes (Signed)
Please call patient or caretaker/sister and advise them that his recent DaTscan does support a parkinsonian diagnosis. We will maintain treatment as currently prescribed.  Please encourage them to make a follow-up appointment with Amy as I do not see one pending. Michel Bickers

## 2019-03-02 ENCOUNTER — Telehealth: Payer: Self-pay | Admitting: Family Medicine

## 2019-03-02 NOTE — Telephone Encounter (Signed)
Please see my result note from 03/01/19.

## 2019-03-02 NOTE — Telephone Encounter (Addendum)
Pt sister(Rimmer, denise on DPR)is asking for a call with the results to the DAT scan when available. When Bagent, denise was told that she asked that NP Amy calls her as well(sister was informed that NP Amy is not in the office today).

## 2019-03-02 NOTE — Telephone Encounter (Signed)
I called pts sister, denise.  Gave her the DAT scan report results per Dr. Rexene Alberts.  Made 05-17-19 at 1300 appt for pt.  She verbalized understanding.

## 2019-05-03 ENCOUNTER — Ambulatory Visit: Payer: Medicare Other | Admitting: Vascular Surgery

## 2019-05-03 ENCOUNTER — Inpatient Hospital Stay (HOSPITAL_COMMUNITY): Admission: RE | Admit: 2019-05-03 | Payer: Medicare Other | Source: Ambulatory Visit

## 2019-05-17 ENCOUNTER — Ambulatory Visit: Payer: Self-pay | Admitting: Family Medicine

## 2019-06-07 ENCOUNTER — Ambulatory Visit: Payer: Medicare Other | Admitting: Vascular Surgery

## 2019-06-07 ENCOUNTER — Ambulatory Visit (HOSPITAL_COMMUNITY): Admission: RE | Admit: 2019-06-07 | Payer: Medicare Other | Source: Ambulatory Visit

## 2019-07-12 ENCOUNTER — Ambulatory Visit: Payer: Self-pay | Admitting: Family Medicine

## 2019-09-07 ENCOUNTER — Telehealth: Payer: Self-pay | Admitting: Family Medicine

## 2019-09-07 DIAGNOSIS — R413 Other amnesia: Secondary | ICD-10-CM

## 2019-09-07 MED ORDER — DONEPEZIL HCL 10 MG PO TABS: 10.0000 mg | ORAL_TABLET | Freq: Every day | ORAL | 3 refills | Status: DC

## 2019-09-07 NOTE — Telephone Encounter (Signed)
Bevis, Chase Logan(sister) has called for a refill on pt's donepezil (ARICEPT) 10 MG tablet.  She agreed to schedule his 3 month f/u that was needed in Oct of 2020.  Sister was told by the pharmacy the refill request was denied, pt would like a call to discuss why.

## 2019-09-07 NOTE — Telephone Encounter (Signed)
I called and clarified dose of medication.  Pt is taking 10mg  po daily.  Will refill to [facility], this was confirmed.

## 2019-09-07 NOTE — Addendum Note (Signed)
Addended by: Hermenia Fiscal S on: 09/07/2019 12:54 PM   Modules accepted: Orders

## 2019-09-27 ENCOUNTER — Encounter (HOSPITAL_COMMUNITY): Payer: Medicare Other

## 2019-09-27 ENCOUNTER — Ambulatory Visit: Payer: Medicare Other | Admitting: Vascular Surgery

## 2019-10-26 ENCOUNTER — Other Ambulatory Visit: Payer: Self-pay

## 2019-10-26 DIAGNOSIS — I70211 Atherosclerosis of native arteries of extremities with intermittent claudication, right leg: Secondary | ICD-10-CM

## 2019-11-08 ENCOUNTER — Other Ambulatory Visit: Payer: Self-pay

## 2019-11-08 ENCOUNTER — Ambulatory Visit (HOSPITAL_COMMUNITY)
Admission: RE | Admit: 2019-11-08 | Discharge: 2019-11-08 | Disposition: A | Discharge status: Home / Self Care | Payer: Medicare Other | Source: Ambulatory Visit | Attending: Pulmonary Disease | Admitting: Pulmonary Disease

## 2019-11-08 ENCOUNTER — Ambulatory Visit (INDEPENDENT_AMBULATORY_CARE_PROVIDER_SITE_OTHER): Payer: Medicare Other | Admitting: Vascular Surgery

## 2019-11-08 ENCOUNTER — Encounter: Payer: Self-pay | Admitting: Vascular Surgery

## 2019-11-08 VITALS — BP 121/67 | HR 71 | Temp 97.3°F | Resp 16 | Ht 74.0 in | Wt 175.8 lb

## 2019-11-08 DIAGNOSIS — I70211 Atherosclerosis of native arteries of extremities with intermittent claudication, right leg: Secondary | ICD-10-CM

## 2019-11-08 NOTE — Progress Notes (Signed)
Patient name: Chase Logan MRN: [id number] DOB: February 23, 1941 Sex: male  REASON FOR CONSULT: 3 month follow-up for PAD and right leg claudication  HPI: Chase Logan is a 79 y.o. male, with history of COPD, Parkinson's, Alzheimer's, hyperlipidemia, tobacco abuse that presents for 3 month follow-up of his PAD.  He was seen earlier this year and been complaining of right leg pain for at least 20 years.  Stated that the leg felt different.  Ultimately at that time was walking around [place] without any significant issues with no pain in the foot.   He did have vascular studies on 11/30/2018 that shows a 30 to 49% stenosis of the right SFA and 50 to 74% stenosis of the left SFA.  He reports no issues with his left leg.  States he smokes about a carton of cigarettes a week.  ABIs were 0.97 on the right 1.01 on the left.  On 41-month follow-up today feels his right leg is about the same.  His sister has noted that at [facility] does not really walk around the store as quickly as he was and sits more.  Still smoking heavily and has no intention of quitting.  Has noticed some pain in the right calf when walking but states not appear overly debilitating or disabling at this time and can live with it.  Has taken the Pletal with no improvement.  Slightly worsening dementia according to the sister.    Past Medical History:  Diagnosis Date   COPD (chronic obstructive pulmonary disease) (HCC)    Nicotine dependence    Other amnesia    Other hypersomnia    Other specified anxiety disorders    Other specified forms of tremor    Pain in right hip    Parkinson's disease (HCC)    Peripheral vascular disease (HCC)    Pure hypercholesterolemia     History reviewed. No pertinent surgical history.  Family History  Problem Relation Age of Onset   Stroke Mother    Hypertension Mother    Hypertension Father    Lung cancer Father     SOCIAL HISTORY: Social History   Socioeconomic History    Marital status: Widowed    Spouse name: Not on file   Number of children: Not on file   Years of education: Not on file   Highest education level: Not on file  Occupational History   Not on file  Tobacco Use   Smoking status: Current Every Day Smoker    Packs/day: 1.00   Smokeless tobacco: Never Used  Substance and Sexual Activity   Alcohol use: Not Currently   Drug use: Never   Sexual activity: Not on file  Other Topics Concern   Not on file  Social History Narrative   Not on file   Social Determinants of Health   Financial Resource Strain:    Difficulty of Paying Living Expenses:   Food Insecurity:    Worried About Programme researcher, broadcasting/film/video in the Last Year:    Barista in the Last Year:   Transportation Needs:    Freight forwarder (Medical):    Lack of Transportation (Non-Medical):   Physical Activity:    Days of Exercise per Week:    Minutes of Exercise per Session:   Stress:    Feeling of Stress :   Social Connections:    Frequency of Communication with Friends and Family:    Frequency of Social Gatherings with Friends and Family:  Attends Religious Services:    Active Member of Clubs or Organizations:    Attends Engineer, structural:    Marital Status:   Intimate Partner Violence:    Fear of Current or Ex-Partner:    Emotionally Abused:    Physically Abused:    Sexually Abused:     No Known Allergies  Current Outpatient Medications  Medication Sig Dispense Refill   ALPRAZolam (XANAX) 0.25 MG tablet Take 0.25 mg by mouth at bedtime as needed.      aspirin EC 81 MG tablet Take 81 mg by mouth daily.     atorvastatin (LIPITOR) 20 MG tablet Take 20 mg by mouth daily.     budesonide-formoterol (SYMBICORT) 160-4.5 MCG/ACT inhaler Inhale 2 puffs into the lungs 2 (two) times daily.     carbidopa-levodopa (SINEMET IR) 25-100 MG tablet Take 1/2 pill twice daily 135 tablet 3   cilostazol (PLETAL) 100 MG tablet  Take 1 tablet (100 mg total) by mouth 2 (two) times daily before a meal. 60 tablet 11   donepezil (ARICEPT) 10 MG tablet Take 1 tablet (10 mg total) by mouth at bedtime. 90 tablet 3   No current facility-administered medications for this visit.    REVIEW OF SYSTEMS:  [X]  denotes positive finding, [ ]  denotes negative finding Cardiac  Comments:  Chest pain or chest pressure:    Shortness of breath upon exertion:    Short of breath when lying flat:    Irregular heart rhythm:        Vascular    Pain in calf, thigh, or hip brought on by ambulation: x Right calf  Pain in feet at night that wakes you up from your sleep:     Blood clot in your veins:    Leg swelling:         Pulmonary    Oxygen at home:    Productive cough:     Wheezing:         Neurologic    Sudden weakness in arms or legs:     Sudden numbness in arms or legs:     Sudden onset of difficulty speaking or slurred speech:    Temporary loss of vision in one eye:     Problems with dizziness:         Gastrointestinal    Blood in stool:     Vomited blood:         Genitourinary    Burning when urinating:     Blood in urine:        Psychiatric    Major depression:         Hematologic    Bleeding problems:    Problems with blood clotting too easily:        Skin    Rashes or ulcers:        Constitutional    Fever or chills:      PHYSICAL EXAM: Vitals:   11/08/19 1020  BP: 121/67  Pulse: 71  Resp: 16  Temp: (!) 97.3 F (36.3 C)  TempSrc: Temporal  SpO2: 98%  Weight: 175 lb 12.8 oz (79.7 kg)  Height: 6\' 2"  (1.88 m)    GENERAL: The patient is a well-nourished male, in no acute distress. The vital signs are documented above. CARDIAC: There is a regular rate and rhythm.  VASCULAR:  2+ femoral pulse palpable bilateral groin No palpable right pedal pulses, no tissue loss on the feet PULMONARY: There is good air exchange bilaterally without  wheezing or rales. ABDOMEN: Soft and non-tender with normal  pitched bowel sounds.  MUSCULOSKELETAL: There are no major deformities or cyanosis. NEUROLOGIC: No focal weakness or paresthesias are detected. SKIN: There are no ulcers or rashes noted. PSYCHIATRIC: The patient has a normal affect.  DATA:   Arterial duplex 11/30/2018 suggest 30 to 49% right SFA stenosis and 50 to 70% left SFA stenosis.    ABI's today 0.94 right biphasic, 1.05 left monophasic  Assessment/Plan:  79 year old male with multiple medical comorbidities including heavy tobacco abuse that presents for 73-month follow-up of his PAD and right lower extremity claudication.  Mostly having calf cramping on the right but this does not appear lifestyle limiting or disabling at this time.  He is still using tobacco and discussed the importance of smoking cessation but he has no intention of quitting.  He did not see any improvement with Pletal and discussed that he can stop this if it did not really provide any relief.  We discussed claudication is not critical limb ischemia and ultimately should only intervene if it becomes disabling and lifestyle limiting and he feels like he has failed medical management.  Ultimately after discussing options with him he feels symptoms are stable at this time.  Will arrange follow-up in 1 year with ABIs.  His sister will contact us if he gets worse in the interim due to some worsening dementia from his Alzheimer's.  Cephus Shelling, MD [facility] Office: [phone]

## 2019-11-29 ENCOUNTER — Telehealth: Payer: Self-pay

## 2019-11-29 NOTE — Telephone Encounter (Signed)
Pt's sister, Angelique Blonder left a VM asking for a call to reschedule the 12/06/2019 appt with Amy for any Tuesday thereafter.

## 2019-12-06 ENCOUNTER — Ambulatory Visit: Payer: Medicare Other | Admitting: Family Medicine

## 2019-12-14 ENCOUNTER — Other Ambulatory Visit: Payer: Self-pay

## 2019-12-14 ENCOUNTER — Ambulatory Visit
Admission: EM | Admit: 2019-12-14 | Discharge: 2019-12-14 | Disposition: A | Discharge status: Home / Self Care | Payer: Medicare Other | Attending: Emergency Medicine | Admitting: Emergency Medicine

## 2019-12-14 DIAGNOSIS — S80861A Insect bite (nonvenomous), right lower leg, initial encounter: Secondary | ICD-10-CM | POA: Diagnosis not present

## 2019-12-14 DIAGNOSIS — L089 Local infection of the skin and subcutaneous tissue, unspecified: Secondary | ICD-10-CM | POA: Diagnosis not present

## 2019-12-14 DIAGNOSIS — W57XXXA Bitten or stung by nonvenomous insect and other nonvenomous arthropods, initial encounter: Secondary | ICD-10-CM | POA: Diagnosis not present

## 2019-12-14 MED ORDER — TRIAMCINOLONE ACETONIDE 0.1 % EX CREA: 1.0000 "application " | TOPICAL_CREAM | Freq: Two times a day (BID) | CUTANEOUS | 0 refills | Status: AC

## 2019-12-14 MED ORDER — DOXYCYCLINE HYCLATE 100 MG PO CAPS: 100.0000 mg | ORAL_CAPSULE | Freq: Two times a day (BID) | ORAL | 0 refills | Status: AC

## 2019-12-14 NOTE — ED Provider Notes (Signed)
[facility]    CSN: [id number] Arrival date & time: 12/14/19  0856      History   Chief Complaint Chief Complaint  Patient presents with  . Insect Bite    x 4 days. Unsure if bite.    HPI Chase Logan is a 79 y.o. male history of COPD, tobacco use, Parkinson's, peripheral vascular disease presenting today for evaluation of possible bug bite.  Patient reports that he developed an area of swelling and redness and discomfort to his right anterior thigh.  Has had worsening pain over the past 4 days.  Denies fevers arthralgias, nausea or vomiting.  Denies other rashes.  Denies removal of any tick.  Denies dizziness or lightheadedness.  Eating and drinking normally.  HPI  Past Medical History:  Diagnosis Date  . COPD (chronic obstructive pulmonary disease) (HCC)   . Nicotine dependence   . Other amnesia   . Other hypersomnia   . Other specified anxiety disorders   . Other specified forms of tremor   . Pain in right hip   . Parkinson's disease (HCC)   . Peripheral vascular disease (HCC)   . Pure hypercholesterolemia     Patient Active Problem List   Diagnosis Date Noted  . Atherosclerosis of native arteries of extremity with intermittent claudication (HCC) 01/25/2019    History reviewed. No pertinent surgical history.     Home Medications    Prior to Admission medications   Medication Sig Start Date End Date Taking? Authorizing Provider  ALPRAZolam (XANAX) 0.25 MG tablet Take 0.25 mg by mouth at bedtime as needed.     [provider]  aspirin EC 81 MG tablet Take 81 mg by mouth daily.    [provider]  atorvastatin (LIPITOR) 20 MG tablet Take 20 mg by mouth daily.    [provider]  budesonide-formoterol (SYMBICORT) 160-4.5 MCG/ACT inhaler Inhale 2 puffs into the lungs 2 (two) times daily.    [provider]  carbidopa-levodopa (SINEMET IR) 25-100 MG tablet Take 1/2 pill twice daily 11/10/18   Lomax, Amy, NP    cilostazol (PLETAL) 100 MG tablet Take 1 tablet (100 mg total) by mouth 2 (two) times daily before a meal. 01/25/19   Cephus Shelling, MD  donepezil (ARICEPT) 10 MG tablet Take 1 tablet (10 mg total) by mouth at bedtime. 09/07/19   Lomax, Amy, NP  doxycycline (VIBRAMYCIN) 100 MG capsule Take 1 capsule (100 mg total) by mouth 2 (two) times daily. 12/14/19   Kenzlee Fishburn C, PA-C  triamcinolone cream (KENALOG) 0.1 % Apply 1 application topically 2 (two) times daily. 12/14/19   Yulianna Folse, Junius Creamer, PA-C    Family History Family History  Problem Relation Age of Onset  . Stroke Mother   . Hypertension Mother   . Hypertension Father   . Lung cancer Father     Social History Social History   Tobacco Use  . Smoking status: Current Every Day Smoker    Packs/day: 1.00  . Smokeless tobacco: Never Used  Vaping Use  . Vaping Use: Never used  Substance Use Topics  . Alcohol use: Not Currently  . Drug use: Never     Allergies   Patient has no known allergies.   Review of Systems Review of Systems  Constitutional: Negative for fatigue and fever.  Eyes: Negative for redness, itching and visual disturbance.  Respiratory: Negative for shortness of breath.   Cardiovascular: Negative for chest pain and leg swelling.  Gastrointestinal: Negative  for nausea and vomiting.  Musculoskeletal: Negative for arthralgias and myalgias.  Skin: Positive for color change. Negative for rash and wound.  Neurological: Negative for dizziness, syncope, weakness, light-headedness and headaches.     Physical Exam Triage Vital Signs ED Triage Vitals  Enc Vitals Group     BP 12/14/19 0953 127/70     Pulse Rate 12/14/19 0953 81     Resp 12/14/19 0953 17     Temp 12/14/19 0953 97.8 F (36.6 C)     Temp Source 12/14/19 0953 Oral     SpO2 12/14/19 0953 96 %     Weight --      Height --      Head Circumference --      Peak Flow --      Pain Score 12/14/19 0956 0     Pain Loc --      Pain Edu? --       Excl. in GC? --    No data found.  Updated Vital Signs BP 127/70 (BP Location: Left Arm)   Pulse 81   Temp 97.8 F (36.6 C) (Oral)   Resp 17   SpO2 96%   Visual Acuity Right Eye Distance:   Left Eye Distance:   Bilateral Distance:    Right Eye Near:   Left Eye Near:    Bilateral Near:     Physical Exam Vitals and nursing note reviewed.  Constitutional:      Appearance: He is well-developed.     Comments: No acute distress  HENT:     Head: Normocephalic and atraumatic.     Nose: Nose normal.  Eyes:     Conjunctiva/sclera: Conjunctivae normal.  Cardiovascular:     Rate and Rhythm: Normal rate and regular rhythm.  Pulmonary:     Effort: Pulmonary effort is normal. No respiratory distress.  Abdominal:     General: There is no distension.  Musculoskeletal:        General: Normal range of motion.     Cervical back: Neck supple.  Skin:    General: Skin is warm and dry.     Comments: Right anterior thigh with 1 cm x 1 cm area of mild induration erythema and central scabbing, no fluctuance  No other rash noted  Neurological:     Mental Status: He is alert and oriented to person, place, and time.      UC Treatments / Results  Labs (all labs ordered are listed, but only abnormal results are displayed) Labs Reviewed - No data to display  EKG   Radiology No results found.  Procedures Procedures (including critical care time)  Medications Ordered in UC Medications - No data to display  Initial Impression / Assessment and Plan / UC Course  I have reviewed the triage vital signs and the nursing notes.  Pertinent labs & imaging results that were available during my care of the patient were reviewed by me and considered in my medical decision making (see chart for details).     Treating for infected insect bite versus early abscess.  Initiating doxycycline, triamcinolone topically, anti-inflammatories for pain and swelling.  Monitor for gradual  resolution.  Discussed strict return precautions. Patient verbalized understanding and is agreeable with plan.  Final Clinical Impressions(s) / UC Diagnoses   Final diagnoses:  Infected insect bite of leg, right, initial encounter     Discharge Instructions     Doxycycline twice daily for the next 10 days Triamcinolone twice daily to area  to help with itching/local inflammation Ibuprofen and Tylenol for pain Follow-up if not improving or worsening   ED Prescriptions    Medication Sig Dispense Auth. Provider   doxycycline (VIBRAMYCIN) 100 MG capsule Take 1 capsule (100 mg total) by mouth 2 (two) times daily. 20 capsule Oakes Mccready C, PA-C   triamcinolone cream (KENALOG) 0.1 % Apply 1 application topically 2 (two) times daily. 30 g Emmarie Sannes, [place] C, PA-C     PDMP not reviewed this encounter.   Lew Dawes, [place] 12/14/19 1044

## 2019-12-14 NOTE — Discharge Instructions (Signed)
Doxycycline twice daily for the next 10 days Triamcinolone twice daily to area to help with itching/local inflammation Ibuprofen and Tylenol for pain Follow-up if not improving or worsening

## 2019-12-14 NOTE — ED Triage Notes (Signed)
Pt states unknown cause but possibly a bug bite that occurred approximately 4 days ago. Pt states no discharge and not painful at this time but is intermittently painful. Pt is aox2-3 and ambulatory.

## 2020-03-06 ENCOUNTER — Encounter: Payer: Self-pay | Admitting: Family Medicine

## 2020-03-06 ENCOUNTER — Ambulatory Visit: Payer: Medicare Other | Admitting: Family Medicine

## 2020-03-06 ENCOUNTER — Emergency Department (HOSPITAL_COMMUNITY)
Admission: EM | Admit: 2020-03-06 | Discharge: 2020-03-13 | Disposition: A | Discharge status: Home / Self Care | Payer: Medicare Other | Attending: Emergency Medicine | Admitting: Emergency Medicine

## 2020-03-06 DIAGNOSIS — R410 Disorientation, unspecified: Secondary | ICD-10-CM | POA: Insufficient documentation

## 2020-03-06 DIAGNOSIS — F17200 Nicotine dependence, unspecified, uncomplicated: Secondary | ICD-10-CM | POA: Diagnosis not present

## 2020-03-06 DIAGNOSIS — Z7982 Long term (current) use of aspirin: Secondary | ICD-10-CM | POA: Insufficient documentation

## 2020-03-06 DIAGNOSIS — R413 Other amnesia: Secondary | ICD-10-CM

## 2020-03-06 DIAGNOSIS — U071 COVID-19: Secondary | ICD-10-CM | POA: Insufficient documentation

## 2020-03-06 DIAGNOSIS — F0390 Unspecified dementia without behavioral disturbance: Secondary | ICD-10-CM | POA: Insufficient documentation

## 2020-03-06 DIAGNOSIS — J449 Chronic obstructive pulmonary disease, unspecified: Secondary | ICD-10-CM | POA: Diagnosis not present

## 2020-03-06 LAB — CBC WITH DIFFERENTIAL/PLATELET
Abs Immature Granulocytes: 0.08 10*3/uL — ABNORMAL HIGH (ref 0.00–0.07)
Basophils Absolute: 0 10*3/uL (ref 0.0–0.1)
Basophils Relative: 0 %
Eosinophils Absolute: 0 10*3/uL (ref 0.0–0.5)
Eosinophils Relative: 0 %
HCT: 45.7 % (ref 39.0–52.0)
Hemoglobin: 15 g/dL (ref 13.0–17.0)
Immature Granulocytes: 1 %
Lymphocytes Relative: 16 %
Lymphs Abs: 0.9 10*3/uL (ref 0.7–4.0)
MCH: 28.8 pg (ref 26.0–34.0)
MCHC: 32.8 g/dL (ref 30.0–36.0)
MCV: 87.7 fL (ref 80.0–100.0)
Monocytes Absolute: 0.7 10*3/uL (ref 0.1–1.0)
Monocytes Relative: 11 %
Neutro Abs: 4.3 10*3/uL (ref 1.7–7.7)
Neutrophils Relative %: 72 %
Platelets: 152 10*3/uL (ref 150–400)
RBC: 5.21 MIL/uL (ref 4.22–5.81)
RDW: 13.7 % (ref 11.5–15.5)
WBC: 6 10*3/uL (ref 4.0–10.5)
nRBC: 0 % (ref 0.0–0.2)

## 2020-03-06 LAB — COMPREHENSIVE METABOLIC PANEL
ALT: 60 U/L — ABNORMAL HIGH (ref 0–44)
AST: 62 U/L — ABNORMAL HIGH (ref 15–41)
Albumin: 3.3 g/dL — ABNORMAL LOW (ref 3.5–5.0)
Alkaline Phosphatase: 94 U/L (ref 38–126)
Anion gap: 14 (ref 5–15)
BUN: 21 mg/dL (ref 8–23)
CO2: 22 mmol/L (ref 22–32)
Calcium: 8.7 mg/dL — ABNORMAL LOW (ref 8.9–10.3)
Chloride: 102 mmol/L (ref 98–111)
Creatinine, Ser: 0.77 mg/dL (ref 0.61–1.24)
GFR, Estimated: >60 mL/min (ref >60)
Glucose, Bld: 99 mg/dL (ref 70–99)
Potassium: 4.4 mmol/L (ref 3.5–5.1)
Sodium: 138 mmol/L (ref 135–145)
Total Bilirubin: 1.1 mg/dL (ref 0.3–1.2)
Total Protein: 6.8 g/dL (ref 6.5–8.1)

## 2020-03-06 LAB — URINALYSIS, ROUTINE W REFLEX MICROSCOPIC
Bilirubin Urine: NEGATIVE
Glucose, UA: NEGATIVE mg/dL
Ketones, ur: NEGATIVE mg/dL
Nitrite: POSITIVE — AB
Protein, ur: NEGATIVE mg/dL
Specific Gravity, Urine: 1.02 (ref 1.005–1.030)
pH: 6 (ref 5.0–8.0)

## 2020-03-06 MED ORDER — ASPIRIN EC 81 MG PO TBEC
81.0000 mg | DELAYED_RELEASE_TABLET | Freq: Every day | ORAL | Status: DC
  Administered 2020-03-07 – 2020-03-13 (×7): 81 mg via ORAL
  Filled 2020-03-06 (×8): qty 1

## 2020-03-06 MED ORDER — CARBIDOPA-LEVODOPA 25-100 MG PO TABS
0.5000 | ORAL_TABLET | Freq: Two times a day (BID) | ORAL | Status: DC
  Administered 2020-03-07 – 2020-03-13 (×14): 0.5 via ORAL
  Filled 2020-03-06 (×16): qty 0.5

## 2020-03-06 MED ORDER — CILOSTAZOL 100 MG PO TABS
100.0000 mg | ORAL_TABLET | Freq: Two times a day (BID) | ORAL | Status: DC
  Administered 2020-03-07 – 2020-03-13 (×11): 100 mg via ORAL
  Filled 2020-03-06 (×17): qty 1

## 2020-03-06 MED ORDER — ATORVASTATIN CALCIUM 10 MG PO TABS
20.0000 mg | ORAL_TABLET | Freq: Every day | ORAL | Status: DC
  Administered 2020-03-07 – 2020-03-13 (×7): 20 mg via ORAL
  Filled 2020-03-06 (×7): qty 2

## 2020-03-06 MED ORDER — ALPRAZOLAM 0.25 MG PO TABS
0.2500 mg | ORAL_TABLET | Freq: Every evening | ORAL | Status: DC | PRN
  Administered 2020-03-07 – 2020-03-10 (×5): 0.25 mg via ORAL
  Filled 2020-03-06 (×6): qty 1

## 2020-03-06 MED ORDER — DONEPEZIL HCL 5 MG PO TABS
10.0000 mg | ORAL_TABLET | Freq: Every day | ORAL | Status: DC
  Administered 2020-03-07 – 2020-03-12 (×7): 10 mg via ORAL
  Filled 2020-03-06 (×7): qty 2

## 2020-03-06 MED ORDER — MOMETASONE FURO-FORMOTEROL FUM 200-5 MCG/ACT IN AERO
2.0000 | INHALATION_SPRAY | Freq: Two times a day (BID) | RESPIRATORY_TRACT | Status: DC
  Administered 2020-03-07 – 2020-03-12 (×9): 2 via RESPIRATORY_TRACT
  Filled 2020-03-06: qty 8.8

## 2020-03-06 MED ORDER — DOXYCYCLINE HYCLATE 100 MG PO TABS
100.0000 mg | ORAL_TABLET | Freq: Two times a day (BID) | ORAL | Status: DC
  Administered 2020-03-07 – 2020-03-13 (×14): 100 mg via ORAL
  Filled 2020-03-06 (×14): qty 1

## 2020-03-06 NOTE — ED Triage Notes (Signed)
Patient BIB EMS from home due to caregiver being found dead in home where patient lives. Patient has history of dementia but is ambulatory without assistance and is AxOx2/3. Patient is easily redirectable and is calm and cooperative.

## 2020-03-06 NOTE — ED Provider Notes (Signed)
[facility]-EMERGENCY DEPT Provider Note   CSN: [id number] Arrival date & time: 03/06/20  2116     History Chief Complaint  Patient presents with  . Dementia    Chase Logan is a 79 y.o. male.  HPI Patient presents from home via EMS.  Patient has dementia, cannot provide many details of his history.  He does deny complaints currently, denies pain, dyspnea, nausea. EMS provides history.  Apparently the patient lives at home, with his sister as his primary caregiver with his history of dementia.  Today someone went to check on the family, found sister deceased, possibly for as long as a day. The patient is unaware of this, and as above, has no complaints. With his history of dementia, inability to live independently, newly concerned with the death of his sister, he was brought here for assistance. Report of the patient has family another states, and Police Department is trying to contact as individuals. The patient himself cannot provide any of these details.    Past Medical History:  Diagnosis Date  . COPD (chronic obstructive pulmonary disease) (HCC)   . Nicotine dependence   . Other amnesia   . Other hypersomnia   . Other specified anxiety disorders   . Other specified forms of tremor   . Pain in right hip   . Parkinson's disease (HCC)   . Peripheral vascular disease (HCC)   . Pure hypercholesterolemia     Patient Active Problem List   Diagnosis Date Noted  . Atherosclerosis of native arteries of extremity with intermittent claudication (HCC) 01/25/2019    No past surgical history on file.     Family History  Problem Relation Age of Onset  . Stroke Mother   . Hypertension Mother   . Hypertension Father   . Lung cancer Father     Social History   Tobacco Use  . Smoking status: Current Every Day Smoker    Packs/day: 1.00  . Smokeless tobacco: Never Used  Vaping Use  . Vaping Use: Never used  Substance Use Topics  . Alcohol use:  Not Currently  . Drug use: Never    Home Medications Prior to Admission medications   Medication Sig Start Date End Date Taking? Authorizing Provider  ALPRAZolam (XANAX) 0.25 MG tablet Take 0.25 mg by mouth at bedtime as needed.     [provider]  aspirin EC 81 MG tablet Take 81 mg by mouth daily.    [provider]  atorvastatin (LIPITOR) 20 MG tablet Take 20 mg by mouth daily.    [provider]  budesonide-formoterol (SYMBICORT) 160-4.5 MCG/ACT inhaler Inhale 2 puffs into the lungs 2 (two) times daily.    [provider]  carbidopa-levodopa (SINEMET IR) 25-100 MG tablet Take 1/2 pill twice daily 11/10/18   Lomax, Amy, NP  cilostazol (PLETAL) 100 MG tablet Take 1 tablet (100 mg total) by mouth 2 (two) times daily before a meal. 01/25/19   Cephus Shelling, MD  donepezil (ARICEPT) 10 MG tablet Take 1 tablet (10 mg total) by mouth at bedtime. 09/07/19   Lomax, Amy, NP  doxycycline (VIBRAMYCIN) 100 MG capsule Take 1 capsule (100 mg total) by mouth 2 (two) times daily. 12/14/19   Wieters, Hallie C, PA-C  triamcinolone cream (KENALOG) 0.1 % Apply 1 application topically 2 (two) times daily. 12/14/19   Wieters, Hallie C, PA-C    Allergies    Patient has no known allergies.  Review of Systems   Review  of Systems  Unable to perform ROS: Dementia    Physical Exam Updated Vital Signs BP 130/68 (BP Location: Left Arm)   Pulse 81   Temp 99.2 F (37.3 C) (Oral)   Resp 17   Ht 6\' 2"  (1.88 m)   Wt 79.7 kg   SpO2 94%   BMI 22.56 kg/m   Physical Exam Vitals and nursing note reviewed.  Constitutional:      General: He is not in acute distress.    Appearance: He is well-developed.     Comments: Withdrawn, but interactive thin elderly male.  HENT:     Head: Normocephalic and atraumatic.  Eyes:     Extraocular Movements: EOM normal.     Conjunctiva/sclera: Conjunctivae normal.  Cardiovascular:     Rate and Rhythm: Normal rate and regular rhythm.   Pulmonary:     Effort: Pulmonary effort is normal. No respiratory distress.     Breath sounds: No stridor.  Abdominal:     General: There is no distension.  Musculoskeletal:        General: No edema.  Skin:    General: Skin is warm and dry.  Neurological:     Mental Status: He is alert.     Cranial Nerves: No cranial nerve deficit, dysarthria or facial asymmetry.     Motor: Tremor present.  Psychiatric:        Mood and Affect: Mood and affect normal.        Behavior: Behavior is slowed and withdrawn.        Cognition and Memory: Memory is impaired.     ED Results / Procedures / Treatments   Labs (all labs ordered are listed, but only abnormal results are displayed) Labs Reviewed  RESP PANEL BY RT-PCR (FLU A&B, COVID) [id number]  COMPREHENSIVE METABOLIC PANEL  CBC WITH DIFFERENTIAL/PLATELET  URINALYSIS, ROUTINE W REFLEX MICROSCOPIC    Procedures Procedures (including critical care time)  Medications Ordered in ED Medications - No data to display  ED Course  I have reviewed the triage vital signs and the nursing notes.  Pertinent labs & imaging results that were available during my care of the patient were reviewed by me and considered in my medical decision making (see chart for details).  Elderly male with dementia, Parkinson's, presents after his primary caregiver was found deceased in his house. Patient still has no complaints, is awake, alert, hemodynamically unremarkable, though withdrawn, without insight into his current presentation. Patient's medical evaluation is generally reassuring, consistent with known history of Parkinson's, deconditioned state. Patient's case referred to our social work colleagues for assistance with placement.  Home meds ordered.  Diet ordered. Final Clinical Impression(s) / ED Diagnoses Final diagnoses:  Confusion     , MD 03/06/20 2334

## 2020-03-07 DIAGNOSIS — U071 COVID-19: Secondary | ICD-10-CM | POA: Diagnosis not present

## 2020-03-07 LAB — RESP PANEL BY RT-PCR (FLU A&B, COVID) ARPGX2
Influenza A by PCR: NEGATIVE
Influenza B by PCR: NEGATIVE
SARS Coronavirus 2 by RT PCR: POSITIVE — AB

## 2020-03-07 NOTE — ED Notes (Signed)
PT in room 

## 2020-03-07 NOTE — NC FL2 (Signed)
[place] MEDICAID FL2 LEVEL OF CARE SCREENING TOOL     IDENTIFICATION  Patient Name: Chase Logan Birthdate: [birth year] Sex: male Admission Date (Current Location): 03/06/2020  [place] and [place] Number:  Haynes Bast [id number] R Facility and Address:  [facility],  [address] [place] [address]      Provider Number:    Attending Physician Name and Address:  Default, Provider, MD  Relative Name and Phone Number:  Leta Speller    Current Level of Care: Hospital Recommended Level of Care: Memory Care Prior Approval Number:    Date Approved/Denied:   PASRR Number: [id number]  Discharge Plan: Other (Comment) (Memory Care)    Current Diagnoses: Patient Active Problem List   Diagnosis Date Noted  . Atherosclerosis of native arteries of extremity with intermittent claudication (HCC) 01/25/2019    Orientation RESPIRATION BLADDER Height & Weight     Self,Place  Normal Continent Weight: 175 lb 11.3 oz (79.7 kg) Height:  6\' 2"  (188 cm)  BEHAVIORAL SYMPTOMS/MOOD NEUROLOGICAL BOWEL NUTRITION STATUS      Continent Diet (Regular)  AMBULATORY STATUS COMMUNICATION OF NEEDS Skin   Supervision Verbally Normal                       Personal Care Assistance Level of Assistance  Bathing,Feeding,Dressing Bathing Assistance: Limited assistance Feeding assistance: Independent Dressing Assistance: Limited assistance     Functional Limitations Info  Sight,Hearing,Speech Sight Info: Adequate Hearing Info: Adequate Speech Info: Adequate    SPECIAL CARE FACTORS FREQUENCY  PT (By licensed PT),OT (By licensed OT)     PT Frequency: 5 times a week OT Frequency: 5 times a week            Contractures Contractures Info: Not present    Additional Factors Info  Code Status,Allergies Code Status Info: Full Code Allergies Info: NKA           Current Medications (03/07/2020):  This is the current hospital active medication list Current  Facility-Administered Medications  Medication Dose Route Frequency Provider Last Rate Last Admin  . ALPRAZolam 03/09/2020) tablet 0.25 mg  0.25 mg Oral QHS PRN Prudy Feeler, MD   0.25 mg at 03/07/20 0114  . aspirin EC tablet 81 mg  81 mg Oral Daily 03/09/20, MD   81 mg at 03/07/20 0919  . atorvastatin (LIPITOR) tablet 20 mg  20 mg Oral Daily 03/09/20, MD   20 mg at 03/07/20 03/09/20  . carbidopa-levodopa (SINEMET IR) 25-100 MG per tablet immediate release 0.5 tablet  0.5 tablet Oral BID 6979, MD   0.5 tablet at 03/07/20 0919  . cilostazol (PLETAL) tablet 100 mg  100 mg Oral BID AC 03/09/20, MD   100 mg at 03/07/20 0750  . donepezil (ARICEPT) tablet 10 mg  10 mg Oral QHS 03/09/20, MD   10 mg at 03/07/20 0112  . doxycycline (VIBRA-TABS) tablet 100 mg  100 mg Oral BID 03/09/20, MD   100 mg at 03/07/20 0919  . mometasone-formoterol (DULERA) 200-5 MCG/ACT inhaler 2 puff  2 puff Inhalation BID 03/09/20, MD   2 puff at 03/07/20 0750   Current Outpatient Medications  Medication Sig Dispense Refill  . ALPRAZolam (XANAX) 0.25 MG tablet Take 0.25 mg by mouth at bedtime as needed.     03/09/20 aspirin EC 81 MG tablet Take 81 mg by mouth daily.    Marland Kitchen atorvastatin (LIPITOR) 20 MG tablet Take 20 mg by mouth daily.    Marland Kitchen  budesonide-formoterol (SYMBICORT) 160-4.5 MCG/ACT inhaler Inhale 2 puffs into the lungs 2 (two) times daily.    . carbidopa-levodopa (SINEMET IR) 25-100 MG tablet Take 1/2 pill twice daily 135 tablet 3  . cilostazol (PLETAL) 100 MG tablet Take 1 tablet (100 mg total) by mouth 2 (two) times daily before a meal. 60 tablet 11  . donepezil (ARICEPT) 10 MG tablet Take 1 tablet (10 mg total) by mouth at bedtime. 90 tablet 3  . doxycycline (VIBRAMYCIN) 100 MG capsule Take 1 capsule (100 mg total) by mouth 2 (two) times daily. 20 capsule 0  . triamcinolone cream (KENALOG) 0.1 % Apply 1 application topically 2 (two) times daily. 30 g 0     Discharge  Medications: Please see discharge summary for a list of discharge medications.  Relevant Imaging Results:  Relevant Lab Results:   Additional Information SSN#:  [id number]  Ameliarose Shark C Tarpley-Carter, LCSWA

## 2020-03-07 NOTE — Progress Notes (Signed)
..   Transition of Care [facility]) - Emergency Department Mini Assessment   Patient Details  Name: Chase Logan MRN: [id number] Date of Birth: Jan 27, 1941  Transition of Care [facility]) CM/SW Contact:    Janet Humphreys C Tarpley-Carter, LCSWA Phone Number: 03/07/2020, 11:05 AM   Clinical Narrative: TOC CM/CSW spoke with Chase Logan/pts nephew due to pts dementia.  Chase Booty lives in [place] and would like to take uncle home with him once he is Covid negative. Until then, Chase Booty is asking for a SNF placement.  Chase Booty has given CSW permission to seek a SNF placement.    Mairany Bruno Tarpley-Carter, MSW, LCSW-A Pronouns:  She, Her, Hers                  [facility] Transitions of CareClinical Social Worker [email or web address] [phone]   ED Mini Assessment: What brought you to the Emergency Department? : EMS, caregiver was found dead in the home.  Barriers to Discharge: No Barriers Identified     Means of departure: Ambulance  Interventions which prevented an admission or readmission: SNF Placement    Patient Contact and Communications Key Contact 1: Chase Logan   Spoke with: Chase Logan Contact Date: 03/07/20,   Contact time: 1020 Contact Phone Number: [phone] Call outcome: Chase Booty would like assistance with SNF placement for uncle.      Choice offered to / list presented to : Adult Children (Adult nephew)  Admission diagnosis:  dementia Patient Active Problem List   Diagnosis Date Noted  . Atherosclerosis of native arteries of extremity with intermittent claudication (HCC) 01/25/2019   PCP:  Corine Shelter, MD Pharmacy:   [facility] [address] ([address]), [place] - [address] [place] (SE) [place] [address] Phone: [phone] Fax: [phone]  [facility] - [place], [place] - [address] [place], [address] [place], [address] [place] [address] Phone: [phone] Fax: [phone]

## 2020-03-07 NOTE — Progress Notes (Signed)
TOC CM/CSW faxed to SNF facilities.  CSW will continue to follow for dc needs.  Shakaya Bhullar Tarpley-Carter, MSW, LCSW-A Pronouns:  She, Her, Hers                  [facility] Transitions of CareClinical Social Worker [email or web address] [phone]

## 2020-03-07 NOTE — ED Provider Notes (Addendum)
Emergency Medicine Observation Re-evaluation Note  Chase Logan is a 79 y.o. male, seen on rounds today.  Pt initially presented to the ED for complaints of Dementia and Covid Positive Currently, the patient is in no acute distress. He is calm in the room and he is taking his morning medications.  Physical Exam  BP (!) 117/59   Pulse 79   Temp 98.8 F (37.1 C)   Resp 20   Ht 6\' 2"  (1.88 m)   Wt 79.7 kg   SpO2 93%   BMI 22.56 kg/m  Physical Exam General: No acute distress Cardiac: Regular rate and rhythm Lungs: Clear to auscultation bilaterally Psych: Calm and cooperative. Frank dementia with poor insight and memory but cooperative and nonviolent.  ED Course / MDM  EKG:    I have reviewed the labs performed to date as well as medications administered while in observation.  Recent changes in the last 24 hours include none.  Plan  Current plan is for social work evaluation as patient was living with his sister who was recently found deceased in the home and he is not able to care for himself. Currently attempting to contact family members out of state to see if that would be an option for the patient if not he will need placement. Patient's labs are relatively normal but he is Covid positive. UA with positive nitrites and trace leukocytes and many bacteria however this may be chronic colonization as patient otherwise has no complaints and has been afebrile. Urine culture will be ordered. PT to evaluate pt today   , MD 03/07/20 03/09/20    0454, MD 03/07/20 1059

## 2020-03-07 NOTE — Evaluation (Addendum)
Physical Therapy Evaluation Patient Details Name: Chase Logan MRN: [id number] DOB: 08-20-1940 Today's Date: 03/07/2020   History of Present Illness  79 yo male admitted to ED. Per chart, caregiver found deceased in home. Pt unsafe to be at home alone and unable to care for himself. Tested (+) for COVID. Hx of dementia, Parkinson's  Clinical Impression  On eval in ED, pt was close Min guard assist for mobility. He is unsteady and fatigues easily. He required at least 1 hand support while "cruising/furniture walking" around room. He is an unreliable historian 2* his dementia. No family present at this time. Per RN, plan is for pt to go to [place]??. Recommend SNF unless family decides to take pt home to [place].     Follow Up Recommendations SNF (Home Health PT;24 hour supervision/assist if family decides to take pt home)  Equipment Recommendations  None recommended by PT    Recommendations for Other Services       Precautions / Restrictions Precautions Precautions: Fall Precaution Comments: airborne Restrictions Weight Bearing Restrictions: No      Mobility  Bed Mobility Overal bed mobility: Modified Independent                  Transfers Overall transfer level: Needs assistance   Transfers: Sit to/from Stand Sit to Stand: Min guard         General transfer comment: unsteady.  Ambulation/Gait Ambulation/Gait assistance: Min guard Gait Distance (Feet): 25 Feet Assistive device:  ("furniture walking/cruising")       General Gait Details: unsteady. pt took a standing rest break after~15 feet with bil UEs resting on surface for support. appears to fatigue easily  Stairs            Wheelchair Mobility    Modified Rankin (Stroke Patients Only)       Balance Overall balance assessment: Needs assistance           Standing balance-Leahy Scale: Fair                               Pertinent Vitals/Pain Pain Assessment: Faces Faces  Pain Scale: No hurt    Home Living Family/patient expects to be discharged to:: Unsure     Type of Home: House                Prior Function           Comments: unreliable historian     Hand Dominance        Extremity/Trunk Assessment   Upper Extremity Assessment Upper Extremity Assessment: Generalized weakness    Lower Extremity Assessment Lower Extremity Assessment: Generalized weakness    Cervical / Trunk Assessment Cervical / Trunk Assessment: Kyphotic  Communication   Communication: No difficulties  Cognition Arousal/Alertness: Awake/alert Behavior During Therapy: WFL for tasks assessed/performed Overall Cognitive Status: History of cognitive impairments - at baseline                                        General Comments      Exercises     Assessment/Plan    PT Assessment Patient needs continued PT services  PT Problem List Decreased mobility;Decreased activity tolerance;Decreased balance;Decreased cognition;Decreased safety awareness       PT Treatment Interventions Gait training;Therapeutic activities;Therapeutic exercise;Balance training;Functional mobility training;Patient/family education    PT Goals (Current  goals can be found in the Care Plan section)  Acute Rehab PT Goals Patient Stated Goal: none stated PT Goal Formulation: Patient unable to participate in goal setting Time For Goal Achievement: 03/21/20 Potential to Achieve Goals: Good    Frequency Min 2X/week   Barriers to discharge        Co-evaluation               AM-PAC PT "6 Clicks" Mobility  Outcome Measure Help needed turning from your back to your side while in a flat bed without using bedrails?: None Help needed moving from lying on your back to sitting on the side of a flat bed without using bedrails?: None Help needed moving to and from a bed to a chair (including a wheelchair)?: A Little Help needed standing up from a chair using your  arms (e.g., wheelchair or bedside chair)?: A Little Help needed to walk in hospital room?: A Little Help needed climbing 3-5 steps with a railing? : A Little 6 Click Score: 20    End of Session   Activity Tolerance: Patient limited by fatigue Patient left: in bed;with call bell/phone within reach (chair alarm pad under pt on stretcher-alarm set)   PT Visit Diagnosis: Unsteadiness on feet (R26.81);Muscle weakness (generalized) (M62.81)    Time: 1535-1550 PT Time Calculation (min) (ACUTE ONLY): 15 min   Charges:   PT Evaluation $PT Eval Moderate Complexity: 1 Mod            Faye Ramsay, PT Acute Rehabilitation  Office: [phone] Pager: [phone]

## 2020-03-08 ENCOUNTER — Other Ambulatory Visit: Payer: Self-pay

## 2020-03-08 DIAGNOSIS — U071 COVID-19: Secondary | ICD-10-CM | POA: Diagnosis not present

## 2020-03-08 NOTE — ED Notes (Signed)
Bed alarm going off to notify staff of pt trying to get out of bed.  Upon entering room, pt was sitting at the foot of the bed attempting to stand. I assisted pt and helped him get onto the bedside and then back in the bed without issue. Pt reconnected to all v/s monitoring. Bed locked and in lowest position w/ bedrails up. New warm blankets were given to pt and lights were turned out at the pt's request.

## 2020-03-08 NOTE — ED Notes (Signed)
Pt has not been eating meal trays.

## 2020-03-08 NOTE — Progress Notes (Signed)
TOC CM/CSW began [place]  Start Date:  03/08/2020  Reference #:  [id number]  Fax #:  [phone]  CSW will continue to follow for dc needs.  Zinedine Ellner Tarpley-Carter, MSW, LCSW-A Pronouns:  She, Her, Hers                  [facility] Transitions of CareClinical Social Worker [email or web address] [phone]

## 2020-03-08 NOTE — ED Notes (Signed)
Bed alarm going off to notify staff of pt trying to get out of bed.  Upon entering room, pt was at the foot of the bed attempting to stand. I assisted pt and helped him get onto the bedside commode. While pt was using bedside potty, bedding was changed.  Pt was then assisted with standing from the bedside commode and perineal area cleaned. Pt was seated back in the bed without issue and reconnected to all v/s monitoring. Bed locked and in lowest position w/ bedrails up.

## 2020-03-08 NOTE — ED Notes (Signed)
Pt found at end of bed getting up without assistance after alarm sounded. Pt was unsteady and preceded to fall back against the bed. Pt became agitated when tech attempted to help. Pt slapped both hands on the bed. Pt then used the bedside commode and was assisted back to bed. Pt given warm blankets and the bed alarm has been reset. Pt resting and comfortable at this time.

## 2020-03-08 NOTE — ED Notes (Signed)
Pt given meal tray (dinner.)

## 2020-03-08 NOTE — Progress Notes (Signed)
TOC CM/CSW contacted Aram Beecham [phone].  Aram Beecham is over Covid bed units.  Information was faxed to [phone].  CSW will continue to follow for dc needs.  Delman Goshorn Tarpley-Carter, MSW, LCSW-A Pronouns:  She, Her, Hers                  [facility] Transitions of CareClinical Social Worker [email or web address] [phone]

## 2020-03-08 NOTE — ED Notes (Signed)
Patient offered breakfast tray but did not want to eat at this time. Patient resting comfortably with no complaints.

## 2020-03-09 DIAGNOSIS — U071 COVID-19: Secondary | ICD-10-CM | POA: Diagnosis not present

## 2020-03-09 LAB — URINE CULTURE: Culture: >=100000 — AB

## 2020-03-09 NOTE — ED Notes (Signed)
Pt able to ambulate to bedside commode. Pt bed an gown changed. Pt has not eaten any of his lunch tray

## 2020-03-09 NOTE — ED Notes (Signed)
Pt given dinner tray.

## 2020-03-09 NOTE — ED Notes (Signed)
Pt given breakfast tray

## 2020-03-09 NOTE — ED Notes (Signed)
Pt assisted to BSC

## 2020-03-09 NOTE — Progress Notes (Signed)
TOC CM/CSW's began [place] and are awaiting a call stating pt's bed insurance authorization has been approved.  Start Date:  03/08/2020  Reference #:  [id number]  Fax #:  [phone]  Inge Rise can be reached for updates at ph: [phone].  CSW will continue to follow for D/C needs.  Dorothe Pea. Kenlyn Lose  MSW, LCSW, LCAS, CCS Transitions of Care Clinical Social Worker Care Coordination Department Ph: [phone]

## 2020-03-09 NOTE — ED Notes (Signed)
In room with patient. Patient eating [place] sandwich with mayo and mustard, fruit cup and drinking water

## 2020-03-10 DIAGNOSIS — U071 COVID-19: Secondary | ICD-10-CM | POA: Diagnosis not present

## 2020-03-10 NOTE — ED Notes (Signed)
Pt calm on admission to the TCU. This Clinical research associate took dinner tray and set it up for pt. He was sleeping and woke up briefly but did not show interest in the dinner tray.

## 2020-03-10 NOTE — TOC Progression Note (Signed)
Transition of Care [facility]) - Progression Note    Patient Details  Name: Chase Logan MRN: [id number] Date of Birth: 05/01/1940  [facility] CM/SW Contact  Lockie Pares, RN Phone Number: 03/10/2020, 1:48 PM  Clinical Narrative:     Continues with pending SNF placements, many declines, no acceptances. Continue to look for placement.     Barriers to Discharge: No Barriers Identified  Expected Discharge Plan and Services                                                 Social Determinants of Health (SDOH) Interventions    Readmission Risk Interventions No flowsheet data found.

## 2020-03-10 NOTE — ED Provider Notes (Signed)
Emergency Medicine Observation Re-evaluation Note  Chase Logan is a 79 y.o. male, seen on rounds today.  Pt initially presented to the ED for complaints of Dementia and Covid Positive Currently, the patient is calm, alert, no distress, o2 sats 93%.   Physical Exam  BP 131/60   Pulse 86   Temp 98 F (36.7 C) (Oral)   Resp 15   Ht 1.88 m (6\' 2" )   Wt 79.7 kg   SpO2 90%   BMI 22.56 kg/m  Physical Exam General: alert, content. Cardiac: normal rate Lungs: breathing comfortably Psych: content, calm.   ED Course / MDM  EKG:    I have reviewed the labs performed to date as well as medications administered while in observation.  Recent changes in the last 24 hours include TOC reassessment, and working on ECF placement.   Plan  Current plan is for [facility] team SNF placement.      [facility], MD 03/10/20 [phone]

## 2020-03-10 NOTE — ED Notes (Signed)
Pt provided breakfast tray, setup up to eat, encouraged to eat breakfast.  Pt able to feed self.

## 2020-03-10 NOTE — ED Notes (Signed)
Pt visualized in room, asleep.  Equal chest rise noted, VS WNL.  Will continue to monitor.

## 2020-03-10 NOTE — ED Notes (Signed)
Pt ambulatory to TCU.  Pt calm and cooperative.

## 2020-03-11 DIAGNOSIS — U071 COVID-19: Secondary | ICD-10-CM | POA: Diagnosis not present

## 2020-03-11 NOTE — ED Notes (Signed)
Pt sleeping, unable to obtain VS at this time.

## 2020-03-11 NOTE — Progress Notes (Addendum)
CSW reviewed HUB - the patient does not have any bed offers at this time. CSW will reach out to facilities to request a review.  CSW spoke with [place] at [facility] who states the facility cannot offer a bed at this time.  CSW spoke with Olegario Messier at [facility] who states the facility is not accepting COVID positive patients.  CSW reached out to [facility] to request a review - waiting for a response.  CSW spoke with Loie at [facility] - there no COVID beds available.   Edwin Dada, MSW, LCSW-A Transitions of Care  Clinical Social Worker I [facility] Emergency Departments  Medical ICU [phone]

## 2020-03-11 NOTE — ED Notes (Signed)
Pt sleeping this shift. Irritable. Flat. Dull. Blunted. Withdrawn.

## 2020-03-12 DIAGNOSIS — U071 COVID-19: Secondary | ICD-10-CM | POA: Diagnosis not present

## 2020-03-12 MED ORDER — CEPHALEXIN 500 MG PO CAPS
500.0000 mg | ORAL_CAPSULE | Freq: Three times a day (TID) | ORAL | Status: DC
  Administered 2020-03-12 – 2020-03-13 (×4): 500 mg via ORAL
  Filled 2020-03-12 (×3): qty 1

## 2020-03-12 NOTE — ED Notes (Signed)
Pt resting.

## 2020-03-12 NOTE — Progress Notes (Signed)
TOC CM/CSW received a call from [place].  Pts Berkley Harvey will be up for review at the end of the day today.  New clinicals will need to be faxed to [phone].  CSW will continue to follow for dc needs.  Dayyan Krist Tarpley-Carter, MSW, LCSW-A Pronouns:  She, Her, Hers                  [facility] Transitions of CareClinical Social Worker [email or web address] [phone]  @ 10:10am  New clinicals were faxed to Pre-Service.

## 2020-03-12 NOTE — Progress Notes (Signed)
Physical Therapy Treatment Patient Details Name: Chase Logan MRN: [id number] DOB: 10-19-1940 Today's Date: 03/12/2020    History of Present Illness 79 yo male admitted to ED. Per chart, caregiver found deceased in home. Pt unsafe to be at home alone and unable to care for himself. Tested (+) for COVID. Hx of dementia, Parkinson's    PT Comments    Pt participated with encouragement. He appears to only want to sleep/stay in bed. He walked ~25 feet around the room. He relies on UE support and objects in environment for steadying. He was pleasant. Will continue to follow during hospital stay.     Follow Up Recommendations  SNF (HHPT and 24 hour supervision/assist if family decides to take pt back home)     Equipment Recommendations  None recommended by PT    Recommendations for Other Services       Precautions / Restrictions Precautions Precautions: Fall Precaution Comments: airborne Restrictions Weight Bearing Restrictions: No    Mobility  Bed Mobility Overal bed mobility: Modified Independent             General bed mobility comments: increased time. cues required to get pt to participate  Transfers Overall transfer level: Needs assistance   Transfers: Sit to/from Stand Sit to Stand: Min guard         General transfer comment: unsteady. cues for safety.  Ambulation/Gait Ambulation/Gait assistance: Min Chemical engineer (Feet): 25 Feet Assistive device:  ("furniture walking/cruising") Gait Pattern/deviations: Step-through pattern;Decreased stride length     General Gait Details: unsteady. generally unsafe. continues to rely on UE support/objects in environment.   Stairs             Wheelchair Mobility    Modified Rankin (Stroke Patients Only)       Balance Overall balance assessment: Needs assistance           Standing balance-Leahy Scale: Poor                              Cognition Arousal/Alertness:  Awake/alert Behavior During Therapy: Flat affect Overall Cognitive Status: History of cognitive impairments - at baseline                                 General Comments: sleepy. not very motivated to participate but eventually agreeable      Exercises      General Comments        Pertinent Vitals/Pain Pain Assessment: Faces Faces Pain Scale: No hurt    Home Living                      Prior Function            PT Goals (current goals can now be found in the care plan section) Progress towards PT goals: Progressing toward goals    Frequency    Min 2X/week      PT Plan Current plan remains appropriate    Co-evaluation              AM-PAC PT "6 Clicks" Mobility   Outcome Measure  Help needed turning from your back to your side while in a flat bed without using bedrails?: None Help needed moving from lying on your back to sitting on the side of a flat bed without using bedrails?: None Help needed moving to and from a  bed to a chair (including a wheelchair)?: A Little Help needed standing up from a chair using your arms (e.g., wheelchair or bedside chair)?: A Little Help needed to walk in hospital room?: A Little Help needed climbing 3-5 steps with a railing? : A Little 6 Click Score: 20    End of Session   Activity Tolerance: Patient limited by fatigue Patient left: in bed;with call bell/phone within reach;with bed alarm set   PT Visit Diagnosis: Unsteadiness on feet (R26.81);Muscle weakness (generalized) (M62.81)     Time: 8850-2774 PT Time Calculation (min) (ACUTE ONLY): 11 min  Charges:  $Gait Training: 8-22 mins                         Faye Ramsay, PT Acute Rehabilitation  Office: [phone] Pager: [phone]

## 2020-03-12 NOTE — ED Notes (Signed)
Patient is resting comfortably. 

## 2020-03-12 NOTE — ED Provider Notes (Signed)
Emergency Medicine Observation Re-evaluation Note  Chase Logan is a 79 y.o. male, seen on rounds today.  Pt initially presented to the ED for complaints of Dementia and Covid Positive Currently, the patient is calm and cooperative.  Physical Exam  BP 117/68   Pulse 90   Temp 98.7 F (37.1 C)   Resp 16   Ht 1.88 m (6\' 2" )   Wt 79.7 kg   SpO2 94%   BMI 22.56 kg/m  Physical Exam General: Alert, calm Cardiac: Regular rate Lungs: Breathing easily no stridor Psych: Calm, cooperative  ED Course / MDM  EKG:    I have reviewed the labs performed to date as well as medications administered while in observation.  Recent changes in the last 24 hours include no acute issues overnight.  Plan  Current plan is for skilled nursing facility placement. Patient also started on antibiotics for E. coli UTI.    , MD 03/12/20 1034

## 2020-03-13 DIAGNOSIS — U071 COVID-19: Secondary | ICD-10-CM | POA: Diagnosis not present

## 2020-03-13 MED ORDER — ALPRAZOLAM 0.25 MG PO TABS: 0.2500 mg | ORAL_TABLET | Freq: Every evening | ORAL | 0 refills | Status: AC | PRN

## 2020-03-13 MED ORDER — BUDESONIDE-FORMOTEROL FUMARATE 160-4.5 MCG/ACT IN AERO: 2.0000 | INHALATION_SPRAY | Freq: Two times a day (BID) | RESPIRATORY_TRACT | 0 refills | Status: AC

## 2020-03-13 MED ORDER — CILOSTAZOL 100 MG PO TABS: 100.0000 mg | ORAL_TABLET | Freq: Two times a day (BID) | ORAL | 0 refills | Status: AC

## 2020-03-13 MED ORDER — CARBIDOPA-LEVODOPA 25-100 MG PO TABS: ORAL_TABLET | ORAL | 0 refills | Status: AC

## 2020-03-13 MED ORDER — ATORVASTATIN CALCIUM 20 MG PO TABS: 20.0000 mg | ORAL_TABLET | Freq: Every day | ORAL | 0 refills | Status: AC

## 2020-03-13 MED ORDER — ASPIRIN EC 81 MG PO TBEC: 81.0000 mg | DELAYED_RELEASE_TABLET | Freq: Every day | ORAL | 0 refills | Status: AC

## 2020-03-13 MED ORDER — DONEPEZIL HCL 10 MG PO TABS: 10.0000 mg | ORAL_TABLET | Freq: Every day | ORAL | 0 refills | Status: AC

## 2020-03-13 MED ORDER — CEPHALEXIN 500 MG PO CAPS: 500.0000 mg | ORAL_CAPSULE | Freq: Four times a day (QID) | ORAL | 0 refills | Status: AC

## 2020-03-13 NOTE — ED Provider Notes (Signed)
Emergency Medicine Observation Re-evaluation Note  Chase Logan is a 79 y.o. male, seen on rounds today.  Pt initially presented to the ED for complaints of Dementia and Covid Positive Currently, the patient is lying in bed watching TV.  He has no complaints.  Physical Exam  BP (!) 147/74 (BP Location: Right Arm)   Pulse 67   Temp 98.6 F (37 C) (Axillary)   Resp 18   Ht 6\' 2"  (1.88 m)   Wt 79.7 kg   SpO2 96%   BMI 22.56 kg/m  Physical Exam General: No acute distress Cardiac: Regular rate and rhythm Lungs: Clear to auscultation bilaterally Psych: Calm and cooperative  ED Course / MDM  EKG:    I have reviewed the labs performed to date as well as medications administered while in observation.  Recent changes in the last 24 hours include none.  Plan  Current plan is for placement at [facility] place today.    [facility], MD 03/13/20 1148

## 2020-03-13 NOTE — TOC Initial Note (Addendum)
Transition of Care [facility]) - Initial/Assessment Note    Patient Details  Name: Chase Logan MRN: [id number] Date of Birth: [birth year]  [facility] CM/SW Contact:    Elliot Cousin, RN Phone Number: [phone] 03/13/2020, 12:31 PM  Clinical Narrative:                 TOC CM spoke to pt's nephew, Ivin Booty Ewing. States he plans to visit [facility] and try to locate pt's paperwork. He has resumed responsibility for pt as his sister passed away. Explained pt was accepted a Therapist, art for SNF rehab. Faxed AVS and RX and PTAR called to [employer], [place].  Contacted [facility] and updated with SNF. Reference # [id number], [facility] RN CM, Sonny, fax clinicals to #[phone]. Next review date will be 03/15/2020. Call report to room, [facility] SNF # [phone], #[phone].       Expected Discharge Plan: Skilled Nursing Facility Barriers to Discharge: No Barriers Identified   Patient Goals and CMS Choice Patient states their goals for this hospitalization and ongoing recovery are:: nephew wants him to go to rehab CMS Medicare.gov Compare Post Acute Care list provided to:: Patient Represenative (must comment) Ivin Booty Ewing # [phone]) Choice offered to / list presented to : Adult Children  Expected Discharge Plan and Services Expected Discharge Plan: Skilled Nursing Facility In-house Referral: Clinical Social Work Discharge Planning Services: CM Consult   Living arrangements for the past 2 months: Mobile Home                                      Prior Living Arrangements/Services Living arrangements for the past 2 months: Mobile Home Lives with:: Siblings (sister passed away) Patient language and need for interpreter reviewed:: No        Need for Family Participation in Patient Care: Yes (Comment) Care giver support system in place?: Yes (comment) Current home services: DME Criminal Activity/Legal Involvement Pertinent to Current Situation/Hospitalization: No -  Comment as needed  Activities of Daily Living      Permission Sought/Granted   Permission granted to share information with : Yes, Verbal Permission Granted  Share Information with NAME: Ivin Booty Ewing  Permission granted to share info w AGENCY: SNF Rehab  Permission granted to share info w Relationship: nephew  Permission granted to share info w Contact Information: [phone]  Emotional Assessment Appearance:: Appears stated age Attitude/Demeanor/Rapport: Gracious Affect (typically observed): Accepting Orientation: : Oriented to Self,Oriented to Place   Psych Involvement: No (comment)  Admission diagnosis:  dementia Patient Active Problem List   Diagnosis Date Noted  . Atherosclerosis of native arteries of extremity with intermittent claudication (HCC) 01/25/2019   PCP:  Corine Shelter, MD Pharmacy:   [facility] [address]), [place] - [address] [place] ([place]) [place] [address] Phone: [phone] Fax: [phone]  [facility] - [place], [place] - [address] [place], [address] [place], [address] [place] [address] Phone: [phone] Fax: [phone]     Social Determinants of Health (SDOH) Interventions    Readmission Risk Interventions No flowsheet data found.

## 2020-03-13 NOTE — Progress Notes (Signed)
TOC CM spoke to [employer], [place]. States she will review for SNF rehab. Isidoro Donning RN CCM, [facility] ED TOC CM [phone]

## 2020-03-13 NOTE — ED Notes (Signed)
Pt resting , sleeping comfortably. Pt used bathroom

## 2020-03-13 NOTE — Discharge Instructions (Signed)
Patient is currently being treated for urinary tract infection.  He will complete a course of antibiotics over the next 6 days.  Covid positive on 03/06/2020

## 2020-03-13 NOTE — ED Notes (Signed)
Pt DC d off unit to facility per provider.  Pt alert, calm, cooperative, no s/s of distress. DC information and belongings given to PTAR .  Pt off unit in stretcher. Pt transported by PTAR.

## 2020-08-11 ENCOUNTER — Other Ambulatory Visit: Payer: Self-pay

## 2020-08-11 DIAGNOSIS — I70211 Atherosclerosis of native arteries of extremities with intermittent claudication, right leg: Secondary | ICD-10-CM

## 2020-12-22 DEATH — deceased

## 2021-03-02 IMAGING — NM NM DATSCAN
2 series · 24 of 30 positions shown · non-contrast
Comparison: Brain MRI 04/20/2018

CLINICAL DATA: 78-year-old male unsteady gait, Parkinson's type
syndrome, hand tremors.

EXAM:
NUCLEAR MEDICINE BRAIN IMAGING WITH SPECT  (DaTscan )
TECHNIQUE: SPECT images of the brain were obtained after intravenous injection
of radiopharmaceutical. 4 hour post injection imaging. Appropriate
positioning. 0.8 ml lugols solution administered in a.m
RADIOPHARMACEUTICALS:  5.0 millicuries I 123 Ioflupane

[Series 2: brain spect · 4.14mm/px · 5 of 120 frames shown]
[frame 11/120  full-range]
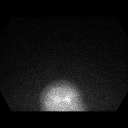
[frame 31/120  full-range]
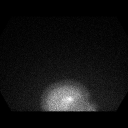
[frame 71/120  full-range]
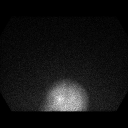
[frame 91/120  full-range]
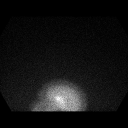
[frame 111/120  full-range]
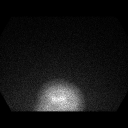

[Series 1016: mpr tra (id) range · 0.89mm/px · 19 of 39 slices shown]
[im 1/39]
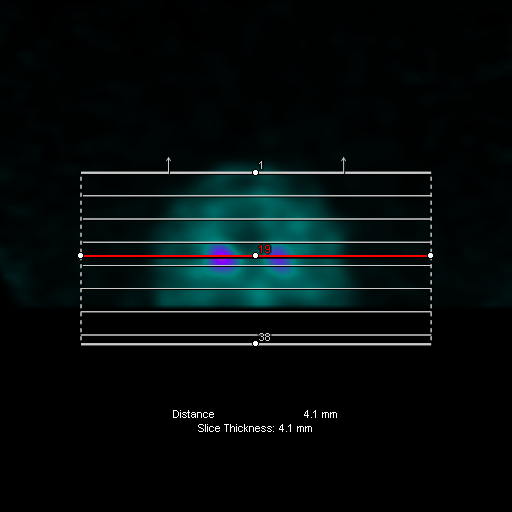
[im 4/39]
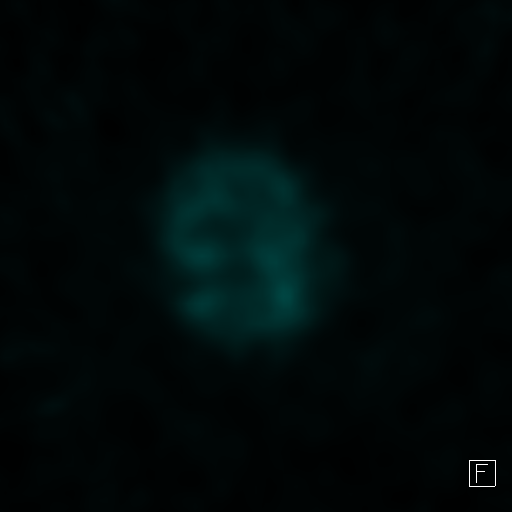
[im 5/39]
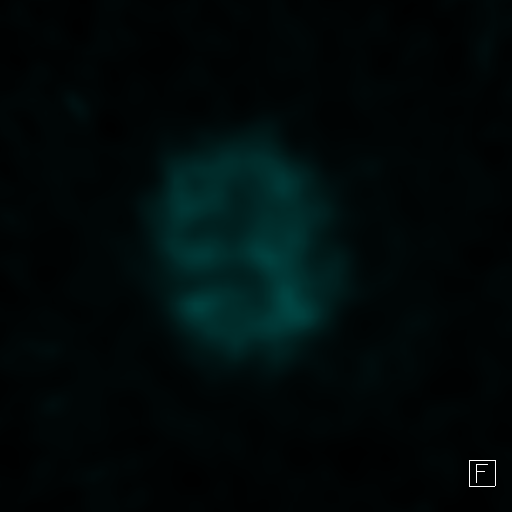
[im 7/39]
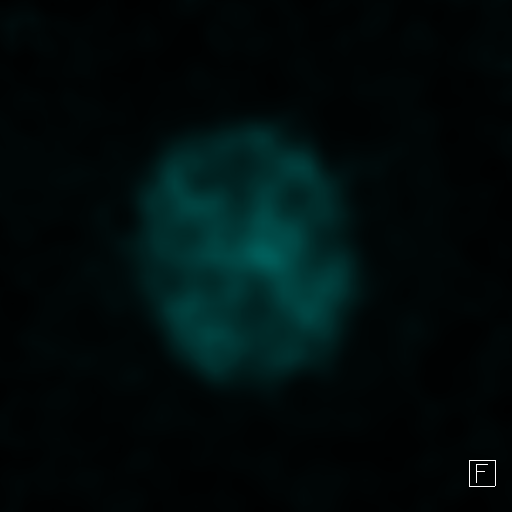
[im 9/39]
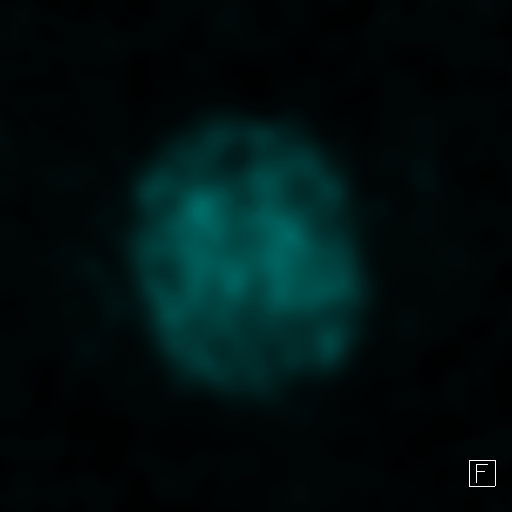
[im 12/39]
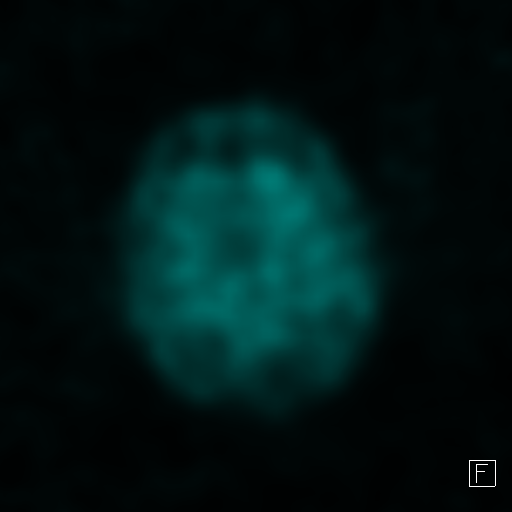
[im 14/39]
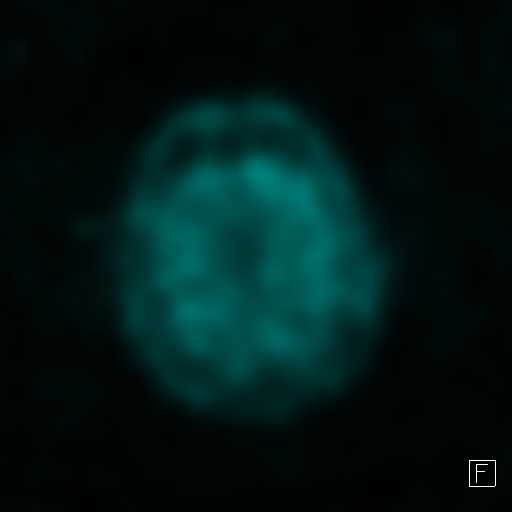
[im 15/39]
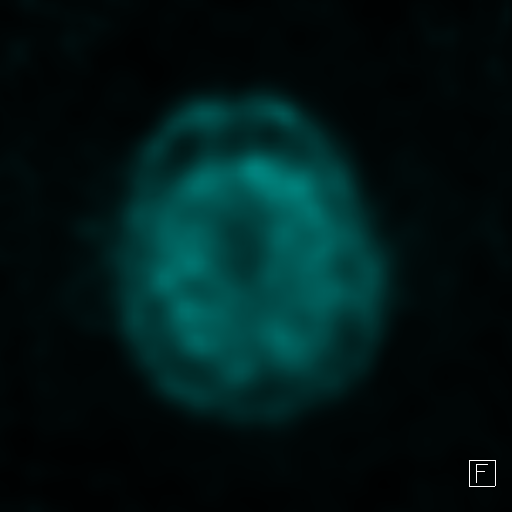
[im 17/39]
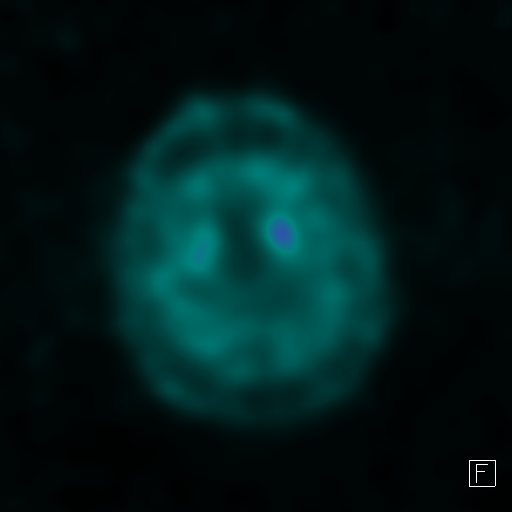
[im 20/39]
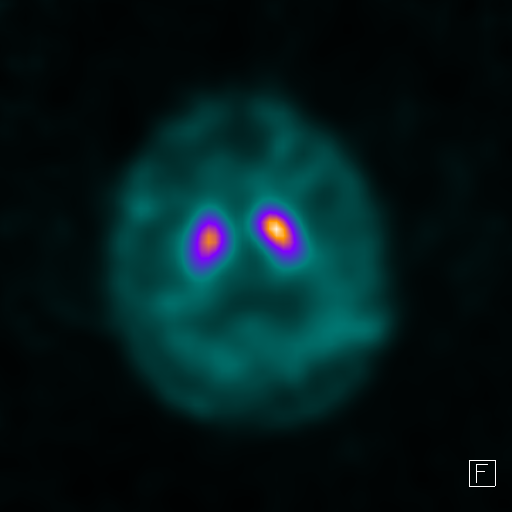
[im 22/39]
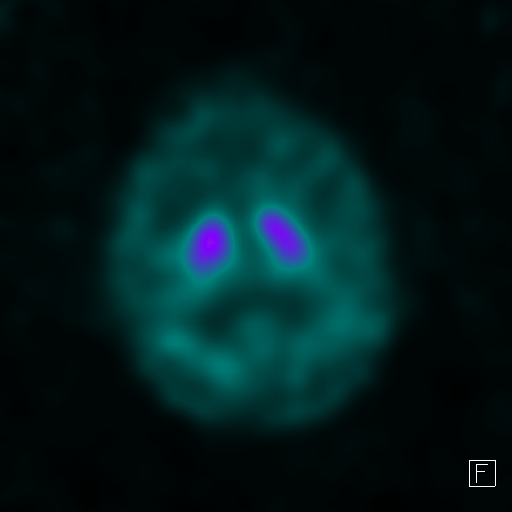
[im 24/39]
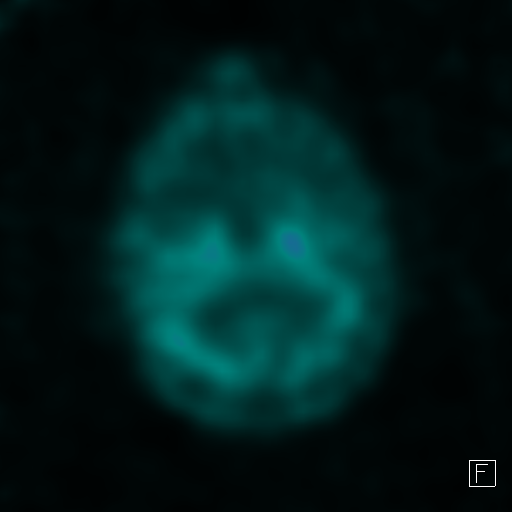
[im 25/39]
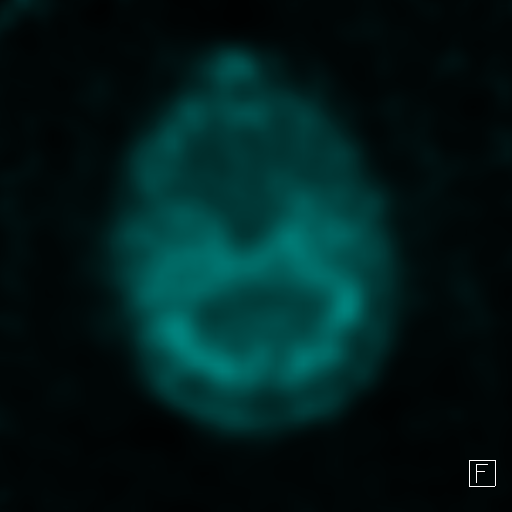
[im 29/39]
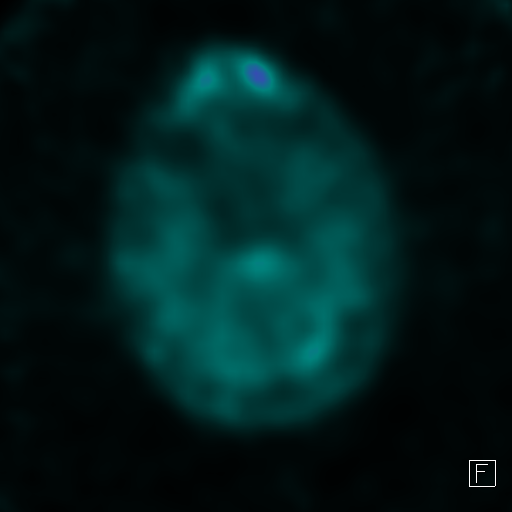
[im 30/39]
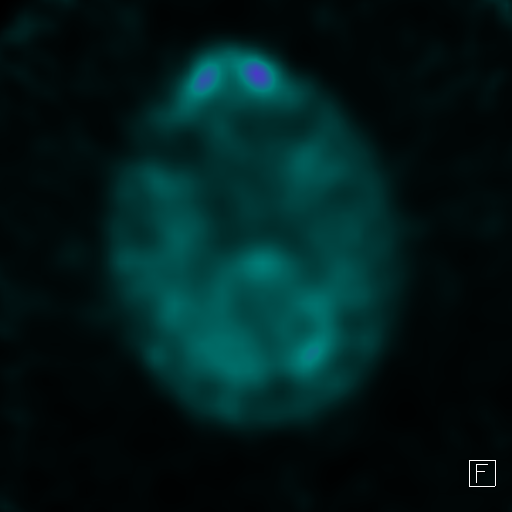
[im 32/39]
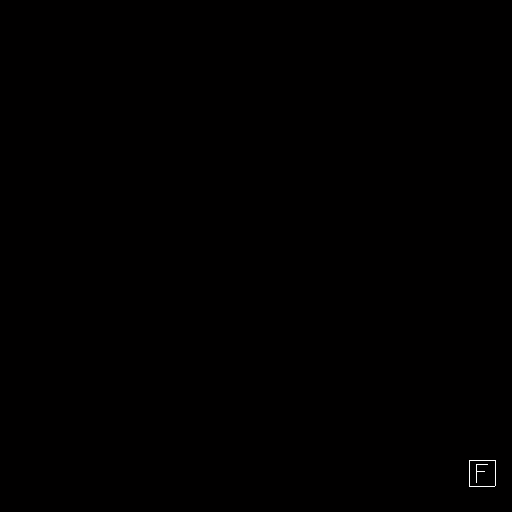
[im 34/39]
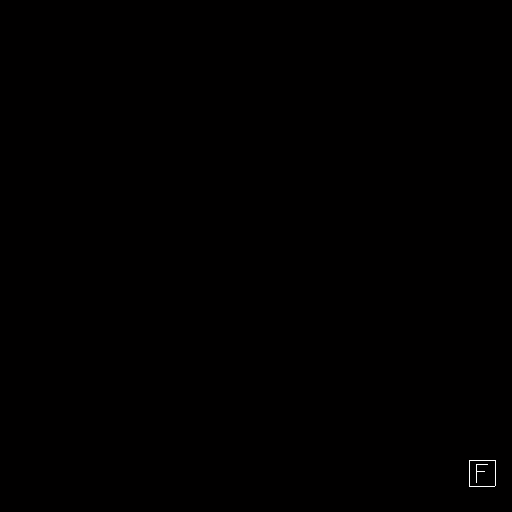
[im 37/39]
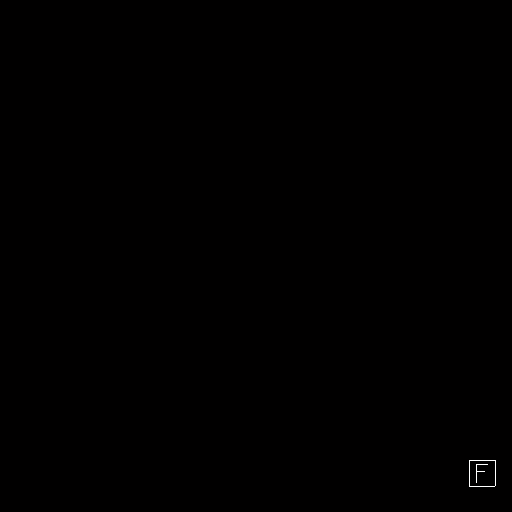
[im 39/39]
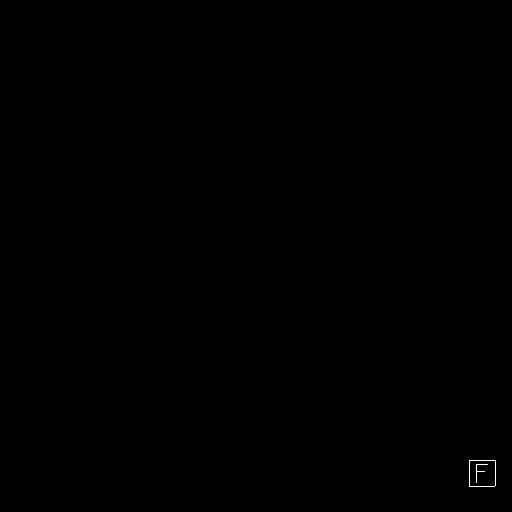

[24 of 30 positions shown; findings below may reference images not displayed]

FINDINGS: Mild decrease in uptake in the RIGHT striata compared to the LEFT.
LEFT and RIGHT striata relatively normal shape (so-called "comma"
shaped) with the RIGHT striata slightly more rounded than the LEFT.
Increased activity in the anterior striata (head of the caudate
nuclei) compared to the posterior striata (putamen) which is
typical.
IMPRESSION: Mild decrease in activity in the RIGHT striata compared to the LEFT
could indicate Parkinson's syndrome pathology.
